# Patient Record
Sex: Male | Born: 1950 | Race: Black or African American | Hispanic: No | Marital: Single | Smoking: Never smoker
Health system: Southern US, Community
[De-identification: ages and names within clinical notes are randomized; demographics above are authoritative.]

## PROBLEM LIST (undated history)

## (undated) DIAGNOSIS — E538 Deficiency of other specified B group vitamins: Secondary | ICD-10-CM

## (undated) DIAGNOSIS — R7989 Other specified abnormal findings of blood chemistry: Secondary | ICD-10-CM

## (undated) DIAGNOSIS — I10 Essential (primary) hypertension: Secondary | ICD-10-CM

## (undated) DIAGNOSIS — R945 Abnormal results of liver function studies: Secondary | ICD-10-CM

## (undated) DIAGNOSIS — N179 Acute kidney failure, unspecified: Secondary | ICD-10-CM

## (undated) DIAGNOSIS — K469 Unspecified abdominal hernia without obstruction or gangrene: Secondary | ICD-10-CM

## (undated) DIAGNOSIS — R310 Gross hematuria: Secondary | ICD-10-CM

## (undated) DIAGNOSIS — D509 Iron deficiency anemia, unspecified: Secondary | ICD-10-CM

## (undated) DIAGNOSIS — N139 Obstructive and reflux uropathy, unspecified: Secondary | ICD-10-CM

## (undated) DIAGNOSIS — E875 Hyperkalemia: Secondary | ICD-10-CM

## (undated) DIAGNOSIS — N289 Disorder of kidney and ureter, unspecified: Secondary | ICD-10-CM

## (undated) HISTORY — DX: Abnormal results of liver function studies: R94.5

## (undated) HISTORY — DX: Hyperkalemia: E87.5

## (undated) HISTORY — DX: Iron deficiency anemia, unspecified: D50.9

## (undated) HISTORY — DX: Acute kidney failure, unspecified: N17.9

## (undated) HISTORY — DX: Other specified abnormal findings of blood chemistry: R79.89

## (undated) SURGERY — CYSTOSCOPY, WITH RETROGRADE PYELOGRAM AND URETERAL STENT INSERTION
Anesthesia: Choice | Laterality: Bilateral

---

## 2003-04-01 ENCOUNTER — Ambulatory Visit (HOSPITAL_COMMUNITY): Admission: RE | Admit: 2003-04-01 | Discharge: 2003-04-01 | Payer: Self-pay | Admitting: Internal Medicine

## 2012-01-17 ENCOUNTER — Other Ambulatory Visit: Payer: Self-pay | Admitting: Family Medicine

## 2012-01-17 ENCOUNTER — Ambulatory Visit (HOSPITAL_COMMUNITY)
Admission: RE | Admit: 2012-01-17 | Discharge: 2012-01-17 | Disposition: A | Discharge status: Home / Self Care | Payer: PRIVATE HEALTH INSURANCE | Source: Ambulatory Visit | Attending: Family Medicine | Admitting: Family Medicine

## 2012-01-17 ENCOUNTER — Encounter (HOSPITAL_COMMUNITY): Payer: Self-pay

## 2012-01-17 DIAGNOSIS — K7689 Other specified diseases of liver: Secondary | ICD-10-CM | POA: Insufficient documentation

## 2012-01-17 DIAGNOSIS — K429 Umbilical hernia without obstruction or gangrene: Secondary | ICD-10-CM | POA: Insufficient documentation

## 2012-01-17 DIAGNOSIS — R319 Hematuria, unspecified: Secondary | ICD-10-CM | POA: Insufficient documentation

## 2012-01-17 DIAGNOSIS — N200 Calculus of kidney: Secondary | ICD-10-CM | POA: Insufficient documentation

## 2012-01-17 DIAGNOSIS — R1032 Left lower quadrant pain: Secondary | ICD-10-CM | POA: Insufficient documentation

## 2012-01-17 HISTORY — DX: Essential (primary) hypertension: I10

## 2012-02-07 ENCOUNTER — Emergency Department (HOSPITAL_COMMUNITY): Payer: PRIVATE HEALTH INSURANCE

## 2012-02-07 ENCOUNTER — Encounter (HOSPITAL_COMMUNITY): Payer: Self-pay | Admitting: *Deleted

## 2012-02-07 ENCOUNTER — Inpatient Hospital Stay (HOSPITAL_COMMUNITY)
Admission: EM | Admit: 2012-02-07 | Discharge: 2012-02-10 | DRG: 694 | Disposition: A | Discharge status: Home / Self Care | Payer: PRIVATE HEALTH INSURANCE | Attending: Internal Medicine | Admitting: Internal Medicine

## 2012-02-07 DIAGNOSIS — N201 Calculus of ureter: Principal | ICD-10-CM | POA: Diagnosis present

## 2012-02-07 DIAGNOSIS — R310 Gross hematuria: Secondary | ICD-10-CM | POA: Diagnosis present

## 2012-02-07 DIAGNOSIS — N139 Obstructive and reflux uropathy, unspecified: Secondary | ICD-10-CM | POA: Diagnosis present

## 2012-02-07 DIAGNOSIS — R112 Nausea with vomiting, unspecified: Secondary | ICD-10-CM | POA: Diagnosis present

## 2012-02-07 DIAGNOSIS — E870 Hyperosmolality and hypernatremia: Secondary | ICD-10-CM | POA: Diagnosis present

## 2012-02-07 DIAGNOSIS — N19 Unspecified kidney failure: Secondary | ICD-10-CM

## 2012-02-07 DIAGNOSIS — E872 Acidosis, unspecified: Secondary | ICD-10-CM | POA: Diagnosis present

## 2012-02-07 DIAGNOSIS — N1330 Unspecified hydronephrosis: Secondary | ICD-10-CM | POA: Diagnosis present

## 2012-02-07 DIAGNOSIS — N179 Acute kidney failure, unspecified: Secondary | ICD-10-CM | POA: Diagnosis present

## 2012-02-07 DIAGNOSIS — N200 Calculus of kidney: Secondary | ICD-10-CM | POA: Diagnosis present

## 2012-02-07 DIAGNOSIS — I10 Essential (primary) hypertension: Secondary | ICD-10-CM | POA: Diagnosis present

## 2012-02-07 DIAGNOSIS — D649 Anemia, unspecified: Secondary | ICD-10-CM | POA: Diagnosis present

## 2012-02-07 DIAGNOSIS — E875 Hyperkalemia: Secondary | ICD-10-CM | POA: Diagnosis present

## 2012-02-07 HISTORY — DX: Disorder of kidney and ureter, unspecified: N28.9

## 2012-02-07 HISTORY — DX: Obstructive and reflux uropathy, unspecified: N13.9

## 2012-02-07 HISTORY — DX: Deficiency of other specified B group vitamins: E53.8

## 2012-02-07 HISTORY — DX: Unspecified abdominal hernia without obstruction or gangrene: K46.9

## 2012-02-07 HISTORY — DX: Gross hematuria: R31.0

## 2012-02-07 LAB — URINE MICROSCOPIC-ADD ON

## 2012-02-07 LAB — COMPREHENSIVE METABOLIC PANEL
AST: 15 U/L (ref 0–37)
Alkaline Phosphatase: 27 U/L — ABNORMAL LOW (ref 39–117)
BUN: 74 mg/dL — ABNORMAL HIGH (ref 6–23)
CO2: 17 mEq/L — ABNORMAL LOW (ref 19–32)
Chloride: 111 mEq/L (ref 96–112)
Creatinine, Ser: 5.75 mg/dL — ABNORMAL HIGH (ref 0.50–1.35)
GFR calc non Af Amer: 10 mL/min — ABNORMAL LOW (ref >90)
Potassium: 6.2 mEq/L — ABNORMAL HIGH (ref 3.5–5.1)
Total Bilirubin: 0.2 mg/dL — ABNORMAL LOW (ref 0.3–1.2)

## 2012-02-07 LAB — CBC
HCT: 40.7 % (ref 39.0–52.0)
MCV: 92.3 fL (ref 78.0–100.0)
RBC: 4.41 MIL/uL (ref 4.22–5.81)
WBC: 6 10*3/uL (ref 4.0–10.5)

## 2012-02-07 LAB — BASIC METABOLIC PANEL
CO2: 20 mEq/L (ref 19–32)
Chloride: 111 mEq/L (ref 96–112)
Potassium: 5.6 mEq/L — ABNORMAL HIGH (ref 3.5–5.1)
Sodium: 141 mEq/L (ref 135–145)

## 2012-02-07 LAB — URINALYSIS, ROUTINE W REFLEX MICROSCOPIC
Glucose, UA: NEGATIVE mg/dL
Leukocytes, UA: NEGATIVE
Nitrite: NEGATIVE
Specific Gravity, Urine: 1.01 (ref 1.005–1.030)
pH: 5.5 (ref 5.0–8.0)

## 2012-02-07 LAB — SURGICAL PCR SCREEN: MRSA, PCR: NEGATIVE

## 2012-02-07 MED ORDER — ACETAMINOPHEN 325 MG PO TABS: 650.0000 mg | ORAL_TABLET | Freq: Four times a day (QID) | ORAL | Status: DC | PRN

## 2012-02-07 MED ORDER — HEPARIN SODIUM (PORCINE) 5000 UNIT/ML IJ SOLN
5000.0000 [IU] | Freq: Three times a day (TID) | INTRAMUSCULAR | Status: DC
  Administered 2012-02-07 – 2012-02-10 (×8): 5000 [IU] via SUBCUTANEOUS
  Filled 2012-02-07 (×8): qty 1

## 2012-02-07 MED ORDER — DOCUSATE SODIUM 100 MG PO CAPS
100.0000 mg | ORAL_CAPSULE | Freq: Two times a day (BID) | ORAL | Status: DC
  Administered 2012-02-07 – 2012-02-10 (×5): 100 mg via ORAL
  Filled 2012-02-07 (×6): qty 1

## 2012-02-07 MED ORDER — SODIUM CHLORIDE 0.9 % IV SOLN
Freq: Once | INTRAVENOUS | Status: AC
  Administered 2012-02-07: 08:00:00 via INTRAVENOUS

## 2012-02-07 MED ORDER — ONDANSETRON HCL 4 MG PO TABS: 4.0000 mg | ORAL_TABLET | Freq: Four times a day (QID) | ORAL | Status: DC | PRN

## 2012-02-07 MED ORDER — ONDANSETRON HCL 4 MG/2ML IJ SOLN
4.0000 mg | Freq: Once | INTRAMUSCULAR | Status: AC
  Administered 2012-02-07: 4 mg via INTRAVENOUS
  Filled 2012-02-07: qty 2

## 2012-02-07 MED ORDER — SODIUM CHLORIDE 0.9 % IV SOLN
INTRAVENOUS | Status: DC
  Administered 2012-02-07: 125 mL/h via INTRAVENOUS
  Administered 2012-02-08 (×2): via INTRAVENOUS

## 2012-02-07 MED ORDER — SODIUM POLYSTYRENE SULFONATE 15 GM/60ML PO SUSP
30.0000 g | Freq: Once | ORAL | Status: AC
  Administered 2012-02-07: 30 g via ORAL
  Filled 2012-02-07: qty 120

## 2012-02-07 MED ORDER — SODIUM CHLORIDE 0.9 % IV BOLUS (SEPSIS): 1000.0000 mL | Freq: Once | INTRAVENOUS | Status: DC

## 2012-02-07 MED ORDER — SODIUM POLYSTYRENE SULFONATE 15 GM/60ML PO SUSP
45.0000 g | Freq: Once | ORAL | Status: AC
  Administered 2012-02-07: 45 g via ORAL
  Filled 2012-02-07: qty 180

## 2012-02-07 MED ORDER — ACETAMINOPHEN 650 MG RE SUPP: 650.0000 mg | Freq: Four times a day (QID) | RECTAL | Status: DC | PRN

## 2012-02-07 MED ORDER — ONDANSETRON HCL 4 MG/2ML IJ SOLN: 4.0000 mg | Freq: Four times a day (QID) | INTRAMUSCULAR | Status: DC | PRN

## 2012-02-07 MED ORDER — SODIUM CHLORIDE 0.9 % IV SOLN: INTRAVENOUS | Status: DC

## 2012-02-07 MED ORDER — HYDROCODONE-ACETAMINOPHEN 5-325 MG PO TABS
1.0000 | ORAL_TABLET | ORAL | Status: DC | PRN
  Administered 2012-02-07 – 2012-02-08 (×2): 1 via ORAL
  Administered 2012-02-09 (×4): 2 via ORAL
  Administered 2012-02-10: 1 via ORAL
  Administered 2012-02-10 (×2): 2 via ORAL
  Filled 2012-02-07: qty 1
  Filled 2012-02-07 (×5): qty 2
  Filled 2012-02-07: qty 1
  Filled 2012-02-07 (×2): qty 2

## 2012-02-07 NOTE — H&P (Addendum)
Patient's PCP: Carmelina Dane  Chief Complaint: decreased appetite/nausea  History of Present Illness: [name] is a 61 y.o. AA male who presents with a 2 weeks history of decreased appetite and nausea.  This started when he was seen in the ER with kidney stone in March.   On CT scan he was found to have:Bilateral distal ureteral stones, 10 mm stone in the distal left ureter and 5 mm stone in the distal right ureter. Moderate left hydronephrosis. No hydronephrosis on the right. The right kidney is atrophic.  He has a history of kidney stones in the past requiring lithotripsy and a stent (years ago).  He states his urine output has been good but initially in march there was quite a bit of blood.  Denies fever and chills.     Meds: Scheduled Meds:    . sodium chloride   Intravenous Once  . sodium chloride   Intravenous STAT  . ondansetron  4 mg Intravenous Once  . sodium polystyrene  30 g Oral Once  . DISCONTD: sodium chloride  1,000 mL Intravenous Once   Continuous Infusions:  PRN Meds:. Allergies: Review of patient's allergies indicates no known allergies. Past Medical History  Diagnosis Date  . Hypertension   . B12 deficiency   . Hernia     umbilical  . Renal disorder     kidney stones   No past surgical history on file. No family history on file. History   Social History  . Marital Status: Single    Spouse Name: N/A    Number of Children: N/A  . Years of Education: N/A   Occupational History  . Not on file.   Social History Main Topics  . Smoking status: Not on file  . Smokeless tobacco: Not on file  . Alcohol Use: No  . Drug Use: No  . Sexually Active: Not on file   Other Topics Concern  . Not on file   Social History Narrative   Lives with fianceNot currently working   Review of Systems: All systems reviewed with the patient and positive as per history of present illness, otherwise all other systems are negative.   Physical Exam: Blood  pressure 122/80, pulse 91, temperature 97.6 F (36.4 C), temperature source Oral, resp. rate 20, height 5\' 10"  (1.778 m), weight 105.235 kg (232 lb), SpO2 98.00%. General: Awake, Oriented x3, No acute distress. HEENT: EOMI, dry mucous membranes Neck: Supple CV: S1 and S2, RRR Lungs: Clear to ascultation bilaterally, no wheezing Abdomen: Soft, Nontender, Nondistended, +bowel sounds, no CVA tenderness Ext: Good pulses. No edema. No clubbing or cyanosis noted. Neuro: Cranial Nerves II-XII grossly intact. Has 5/5 motor strength in upper and lower extremities.   Lab results:  [facility] 02/07/12 0654  NA 140  K 6.2*  CL 111  CO2 17*  GLUCOSE 120*  BUN 74*  CREATININE 5.75*  CALCIUM 10.1  MG --  PHOS --    Basename 02/07/12 0654  AST 15  ALT 19  ALKPHOS 27*  BILITOT 0.2*  PROT 8.2  ALBUMIN 4.0    Basename 02/07/12 0654  LIPASE 71*  AMYLASE --    Basename 02/07/12 0654  WBC 6.0  NEUTROABS --  HGB 13.0  HCT 40.7  MCV 92.3  PLT 306   No results found for this basename: CKTOTAL:3,CKMB:3,CKMBINDEX:3,TROPONINI:3 in the last 72 hours No components found with this basename: POCBNP:3 No results found for this basename: DDIMER in the last 72 hours No results found  for this basename: HGBA1C:2 in the last 72 hours No results found for this basename: CHOL:2,HDL:2,LDLCALC:2,TRIG:2,CHOLHDL:2,LDLDIRECT:2 in the last 72 hours No results found for this basename: TSH,T4TOTAL,FREET3,T3FREE,THYROIDAB in the last 72 hours No results found for this basename: VITAMINB12:2,FOLATE:2,FERRITIN:2,TIBC:2,IRON:2,RETICCTPCT:2 in the last 72 hours Imaging results:  Ct Abdomen Pelvis Wo Contrast  02/07/2012  *RADIOLOGY REPORT*  Clinical Data: Nausea.  Recent passage of kidney stones.  CT ABDOMEN AND PELVIS WITHOUT CONTRAST  Technique:  Multidetector CT imaging of the abdomen and pelvis was performed following the standard protocol without intravenous contrast.  Comparison: 01/17/2012  Findings: A  left kidney lower pole nonobstructive calculus measures 1.8 x 1.2 cm on image 32 of series 2.  Right renal atrophy noted.  A 0.9 x 0.5 cm right collecting system calculus is present and there is a 0.5 cm right UVJ calculus which currently appears nonobstructive.  There is reduced hydronephrosis on the left compared the prior exam.  There is continued left hydroureter, that the large left distal ureteral calculus shown on the prior exam is no longer readily apparent.  There is a transitional in left ureteral caliber in the distal ureter.  The right-sided previously seen distal ureteral calculus is no longer visible. 10.  No calculus is visible in the urinary bladder.  Stable fullness of the lateral limb of the left adrenal gland noted.  An umbilical hernia contains adipose tissue.  The appendix appears normal. Bridging spurring of the sacroiliac joints noted.  Disc bulges noted at the L3-4, L4-5, and L5-S1 levels.  IMPRESSION:  1.  The previously seen distal ureteral calculi are no longer readily apparent.  The left hydronephrosis has resolved, but there is continued left hydroureter extending down to the distal ureter. 2.  Large left kidney lower pole nonobstructive calculus, stable. 3.  5 mm right UVJ calculus is not currently causing hydronephrosis.  There is also a 9 mm right collecting system calculus. 4.  Umbilical hernia contains adipose tissue. 5.  Lower lumbar spondylosis.  Original Report Authenticated By: Dellia Cloud, M.D.   Ct Abdomen Pelvis Wo Contrast  01/17/2012  *RADIOLOGY REPORT*  Clinical Data: Hematuria, left flank pain.  CT ABDOMEN AND PELVIS WITHOUT CONTRAST  Technique:  Multidetector CT imaging of the abdomen and pelvis was performed following the standard protocol without intravenous contrast.  Comparison: None  Findings: Lung bases are clear.  No effusions.  Heart is normal size.  There is moderate left hydronephrosis and hydroureter.  10 mm distal left ureteral stones.  Right  kidney is atrophic.  6 mm stone is layering dependently in the right renal pelvis.  Despite right hydronephrosis, there is a 5 mm distal right ureteral stone. Bilateral nephrolithiasis.  15 mm stone in the lower pole of the left kidney.  Diffuse fatty infiltration of the liver.  Spleen, pancreas, adrenals and gallbladder are unremarkable.  There is an umbilical hernia containing fat.  Appendix is visualized and is normal. Bowel grossly unremarkable.  No free fluid, free air, or adenopathy.  IMPRESSION: Bilateral distal ureteral stones, 10 mm stone in the distal left ureter and 5 mm stone in the distal right ureter.  Moderate left hydronephrosis.  No hydronephrosis on the right.  The right kidney is atrophic.  Bilateral nephrolithiasis.  Fatty liver.  Umbilical hernia containing fat.  Original Report Authenticated By: Cyndie Chime, M.D.   Other results: EKG: NSR, no peaked T waves  Assessment & Plan by Problem:   Nausea & vomiting- zofran PRN, seems to have resolved  some, patient eating lunch, encourage PO fluid intake- not eating/drinking well for 2 weeks   AKI (acute kidney injury)- IVF- ? Dehydration in combination with kidney stones and lisinopril, await U/A, trend Cr on BMP and consider nephrology if not better, monitor I/Os, patient denies history of increased Cr- only kidney stones   Hyperkalemia- kay exelate, recheck BMP this evening   Renal calculi- will consult urology to see if intervention needed- patient seems to have passed stone causes the obstruction  Dehydration- IVF   ADDENDUM: consulted urology, plan to place B/L stents tomm to help with hydronephrosis and stones   Time spent on admission, talking to the patient, and coordinating care was: 55 mins.  , , DO 02/07/2012, 12:39 PM

## 2012-02-07 NOTE — ED Notes (Signed)
Pt cont to wait for inpt bed. Pt and family aware

## 2012-02-07 NOTE — ED Notes (Signed)
Reports decreased appetite x 2 weeks; denies pain; denies n/v

## 2012-02-07 NOTE — ED Notes (Signed)
Pt waiting for bed assignment. Pt and family aware. Pt aware of need for urine specimen. Unable to void at present

## 2012-02-07 NOTE — ED Notes (Signed)
EDP  back in with pt. Pt to be admit to hospital

## 2012-02-07 NOTE — ED Provider Notes (Addendum)
History  This chart was scribed for Benny Lennert, MD by Cherlynn Perches. The patient was seen in room APA12/APA12. Patient's care was started at 0627.    CSN: [id number]  Arrival date & time 02/07/12  3716   First MD Initiated Contact with Patient 02/07/12 [phone]      Chief Complaint  Patient presents with  . Anorexia    decreased appetite x 2 weeks    (Consider location/radiation/quality/duration/timing/severity/associated sxs/prior treatment) Patient is a 61 y.o. male presenting with weakness. The history is provided by the patient. No language interpreter was used.  Weakness The primary symptoms include nausea. Primary symptoms do not include headaches, seizures or fever. The symptoms began more than 1 week ago. The symptoms are unchanged. The neurological symptoms are multifocal. Context: nothing.  Additional symptoms include weakness. Additional symptoms do not include hallucinations.    [name] is a 61 y.o. male with a h/o kidney stones who presents to the Emergency Department complaining of 3 days of sudden onset, unchanging decreased appetite with associated nausea. Pt reports visiting the [facility] a week ago for kidney stones. A CT scan of the abdomen confirmed that he had two kidney stones, one in each kidney. Pt states that he has had kidney stones "quite a few times," but is unsure if they have every been analyzed. Pt reports passing both kidney stones on Saturday. Pt states that his decreased appetite began after passing the stones. Pt reports that nausea worsens when he tries to eat. Pt denies hematuria at present, but states that he noticed blood in his urine before passing the kidney stones. Pt denies abdominal pain, fever, chills. Pt has a history of HTN.  Past Medical History  Diagnosis Date  . Hypertension   . B12 deficiency   . Hernia     umbilical  . Renal disorder     kidney stones    No past surgical history on file.  No  family history on file.  History  Substance Use Topics  . Smoking status: Not on file  . Smokeless tobacco: Not on file  . Alcohol Use:       Review of Systems  Constitutional: Positive for appetite change. Negative for fever, chills and fatigue.  HENT: Negative for congestion, sinus pressure and ear discharge.   Eyes: Negative for discharge.  Respiratory: Negative for cough.   Cardiovascular: Negative for chest pain.  Gastrointestinal: Positive for nausea. Negative for abdominal pain and diarrhea.  Genitourinary: Negative for frequency and hematuria.  Musculoskeletal: Negative for back pain.  Skin: Negative for rash.  Neurological: Positive for weakness. Negative for seizures and headaches.  Hematological: Negative.   Psychiatric/Behavioral: Negative for hallucinations.    Allergies  Review of patient's allergies indicates no known allergies.  Home Medications   Current Outpatient Rx  Name Route Sig Dispense Refill  . CYANOCOBALAMIN 1000 MCG PO TABS Intramuscular Inject 100 mcg into the muscle every 30 (thirty) days.    Marland Kitchen LISINOPRIL 20 MG PO TABS Oral Take 20 mg by mouth daily.      Triage Vitals: BP 113/71  Pulse 84  Resp 20  Ht 5\' 9"  (1.753 m)  Wt 232 lb (105.235 kg)  BMI 34.26 kg/m2  SpO2 97%  Physical Exam  Nursing note and vitals reviewed. Constitutional: He is oriented to person, place, and time. He appears well-developed and well-nourished.  HENT:  Head: Normocephalic and atraumatic.       Dry mucous membranes  Eyes: Conjunctivae  and EOM are normal. Pupils are equal, round, and reactive to light. No scleral icterus.  Neck: Normal range of motion. Neck supple. No thyromegaly present.  Cardiovascular: Normal rate and regular rhythm.  Exam reveals no gallop and no friction rub.   No murmur heard. Pulmonary/Chest: Effort normal and breath sounds normal. No stridor. He has no wheezes. He has no rales. He exhibits no tenderness.  Abdominal: Soft. He exhibits  no distension. There is no tenderness. There is no rebound.       Umbilical hernia - 3 cm in diameter, non-tender  Musculoskeletal: Normal range of motion. He exhibits no edema.  Lymphadenopathy:    He has no cervical adenopathy.  Neurological: He is alert and oriented to person, place, and time. Coordination normal.  Skin: Skin is warm and dry. No rash noted. No erythema.  Psychiatric: He has a normal mood and affect. His behavior is normal.    ED Course  Procedures (including critical care time)  DIAGNOSTIC STUDIES: Oxygen Saturation is 97% on room air, adequate by my interpretation.    COORDINATION OF CARE:  7:29 AM - Will give pt fluid for dehydration and look at previous scans. Discussed treatment plan with pt and he agrees.    Labs Reviewed  CBC  COMPREHENSIVE METABOLIC PANEL  LIPASE, BLOOD   No results found.   No diagnosis found.  Date: 02/07/2012  Rate: 84  Rhythm: normal sinus rhythm  QRS Axis: normal  Intervals: normal  ST/T Wave abnormalities: normal  Conduction Disutrbances:none  Narrative Interpretation:   Old EKG Reviewed: none available     MDM     The chart was scribed for me under my direct supervision.  I personally performed the history, physical, and medical decision making and all procedures in the evaluation of this patient.Benny Lennert, MD 02/07/12 0830  Benny Lennert, MD 02/08/12 [phone]

## 2012-02-07 NOTE — ED Provider Notes (Signed)
History     CSN: [id number]  Arrival date & time 02/07/12  4098   First MD Initiated Contact with Patient 02/07/12 [phone]      Chief Complaint  Patient presents with  . Anorexia    decreased appetite x 2 weeks    (Consider location/radiation/quality/duration/timing/severity/associated sxs/prior treatment) HPI  Past Medical History  Diagnosis Date  . Hypertension   . B12 deficiency   . Hernia     umbilical  . Renal disorder     kidney stones    No past surgical history on file.  No family history on file.  History  Substance Use Topics  . Smoking status: Not on file  . Smokeless tobacco: Not on file  . Alcohol Use:       Review of Systems  Allergies  Review of patient's allergies indicates no known allergies.  Home Medications   Current Outpatient Rx  Name Route Sig Dispense Refill  . CYANOCOBALAMIN 1000 MCG PO TABS Intramuscular Inject 100 mcg into the muscle every 30 (thirty) days.    Marland Kitchen LISINOPRIL 20 MG PO TABS Oral Take 20 mg by mouth daily.      BP 113/71  Pulse 84  Resp 20  Ht 5\' 9"  (1.753 m)  Wt 232 lb (105.235 kg)  BMI 34.26 kg/m2  SpO2 97%  Physical Exam  ED Course  Procedures (including critical care time)   Labs Reviewed  CBC  COMPREHENSIVE METABOLIC PANEL  LIPASE, BLOOD   No results found.   No diagnosis found.    MDM          Benny Lennert, MD 02/08/12 [phone]

## 2012-02-07 NOTE — Consult Note (Signed)
Report#[id number]

## 2012-02-08 ENCOUNTER — Encounter (HOSPITAL_COMMUNITY): Payer: Self-pay | Admitting: Anesthesiology

## 2012-02-08 ENCOUNTER — Encounter (HOSPITAL_COMMUNITY)
Admission: EM | Disposition: A | Discharge status: Home / Self Care | Payer: Self-pay | Source: Home / Self Care | Attending: Internal Medicine

## 2012-02-08 ENCOUNTER — Inpatient Hospital Stay (HOSPITAL_COMMUNITY): Payer: PRIVATE HEALTH INSURANCE | Admitting: Anesthesiology

## 2012-02-08 ENCOUNTER — Encounter (HOSPITAL_COMMUNITY): Payer: Self-pay | Admitting: Internal Medicine

## 2012-02-08 ENCOUNTER — Encounter (HOSPITAL_COMMUNITY): Payer: Self-pay | Admitting: *Deleted

## 2012-02-08 ENCOUNTER — Inpatient Hospital Stay (HOSPITAL_COMMUNITY): Payer: PRIVATE HEALTH INSURANCE

## 2012-02-08 DIAGNOSIS — E870 Hyperosmolality and hypernatremia: Secondary | ICD-10-CM | POA: Diagnosis present

## 2012-02-08 DIAGNOSIS — N139 Obstructive and reflux uropathy, unspecified: Secondary | ICD-10-CM

## 2012-02-08 HISTORY — PX: CYSTOSCOPY W/ URETERAL STENT PLACEMENT: SHX1429

## 2012-02-08 HISTORY — DX: Obstructive and reflux uropathy, unspecified: N13.9

## 2012-02-08 LAB — CBC
Hemoglobin: 11.6 g/dL — ABNORMAL LOW (ref 13.0–17.0)
MCH: 29.7 pg (ref 26.0–34.0)
MCV: 93.1 fL (ref 78.0–100.0)
RBC: 3.91 MIL/uL — ABNORMAL LOW (ref 4.22–5.81)

## 2012-02-08 LAB — POCT I-STAT 4, (NA,K, GLUC, HGB,HCT)
HCT: 38 % — ABNORMAL LOW (ref 39.0–52.0)
Hemoglobin: 12.9 g/dL — ABNORMAL LOW (ref 13.0–17.0)
Potassium: 5.2 mEq/L — ABNORMAL HIGH (ref 3.5–5.1)

## 2012-02-08 LAB — BASIC METABOLIC PANEL
CO2: 21 mEq/L (ref 19–32)
Chloride: 118 mEq/L — ABNORMAL HIGH (ref 96–112)
GFR calc non Af Amer: 13 mL/min — ABNORMAL LOW (ref >90)
Glucose, Bld: 106 mg/dL — ABNORMAL HIGH (ref 70–99)
Potassium: 5.8 mEq/L — ABNORMAL HIGH (ref 3.5–5.1)
Sodium: 147 mEq/L — ABNORMAL HIGH (ref 135–145)

## 2012-02-08 SURGERY — CYSTOSCOPY, WITH RETROGRADE PYELOGRAM AND URETERAL STENT INSERTION
Anesthesia: Spinal | Site: Penis | Laterality: Bilateral | Wound class: Clean Contaminated

## 2012-02-08 MED ORDER — LIDOCAINE HCL (PF) 1 % IJ SOLN
INTRAMUSCULAR | Status: AC
  Filled 2012-02-08: qty 5

## 2012-02-08 MED ORDER — FENTANYL CITRATE 0.05 MG/ML IJ SOLN: 25.0000 ug | INTRAMUSCULAR | Status: DC | PRN

## 2012-02-08 MED ORDER — MIDAZOLAM HCL 2 MG/2ML IJ SOLN
INTRAMUSCULAR | Status: AC
  Filled 2012-02-08: qty 2

## 2012-02-08 MED ORDER — MIDAZOLAM HCL 2 MG/2ML IJ SOLN
1.0000 mg | INTRAMUSCULAR | Status: DC | PRN
Start: 2012-02-08 — End: 2012-02-08
  Administered 2012-02-08: 2 mg via INTRAVENOUS

## 2012-02-08 MED ORDER — 0.9 % SODIUM CHLORIDE (POUR BTL) OPTIME
TOPICAL | Status: DC | PRN
  Administered 2012-02-08: 1000 mL

## 2012-02-08 MED ORDER — SODIUM CHLORIDE 0.9 % IV SOLN
INTRAVENOUS | Status: DC
  Administered 2012-02-08: 800 mL via INTRAVENOUS
  Administered 2012-02-08: 18:00:00 via INTRAVENOUS

## 2012-02-08 MED ORDER — SODIUM CHLORIDE 0.9 % IR SOLN
Status: DC | PRN
  Administered 2012-02-08: 3000 mL

## 2012-02-08 MED ORDER — FENTANYL CITRATE 0.05 MG/ML IJ SOLN
INTRAMUSCULAR | Status: AC
  Filled 2012-02-08: qty 2

## 2012-02-08 MED ORDER — LIDOCAINE IN DEXTROSE 5-7.5 % IV SOLN
INTRAVENOUS | Status: DC | PRN
  Administered 2012-02-08: 8 mg via INTRATHECAL

## 2012-02-08 MED ORDER — PROPOFOL 10 MG/ML IV EMUL
INTRAVENOUS | Status: AC
  Filled 2012-02-08: qty 20

## 2012-02-08 MED ORDER — PHENYLEPHRINE HCL 10 MG/ML IJ SOLN
INTRAMUSCULAR | Status: DC | PRN
  Administered 2012-02-08: 100 ug via INTRAVENOUS
  Administered 2012-02-08 (×2): 50 ug via INTRAVENOUS
  Administered 2012-02-08 (×3): 100 ug via INTRAVENOUS

## 2012-02-08 MED ORDER — IOHEXOL 350 MG/ML SOLN
INTRAVENOUS | Status: DC | PRN
  Administered 2012-02-08 (×2): 50 mL

## 2012-02-08 MED ORDER — DEXTROSE-NACL 5-0.45 % IV SOLN
INTRAVENOUS | Status: DC
  Administered 2012-02-08: 1000 mL via INTRAVENOUS
  Administered 2012-02-09: 06:00:00 via INTRAVENOUS

## 2012-02-08 MED ORDER — PHENYLEPHRINE HCL 10 MG/ML IJ SOLN
INTRAMUSCULAR | Status: AC
  Filled 2012-02-08: qty 1

## 2012-02-08 MED ORDER — EPHEDRINE SULFATE 50 MG/ML IJ SOLN
INTRAMUSCULAR | Status: DC | PRN
  Administered 2012-02-08: 5 mg via INTRAVENOUS

## 2012-02-08 MED ORDER — FUROSEMIDE 10 MG/ML IJ SOLN
10.0000 mg | Freq: Two times a day (BID) | INTRAMUSCULAR | Status: DC
  Administered 2012-02-09 – 2012-02-10 (×3): 10 mg via INTRAVENOUS
  Filled 2012-02-08 (×3): qty 2

## 2012-02-08 MED ORDER — ONDANSETRON HCL 4 MG/2ML IJ SOLN
INTRAMUSCULAR | Status: AC
  Filled 2012-02-08: qty 2

## 2012-02-08 MED ORDER — SODIUM CHLORIDE 0.45 % IV SOLN
INTRAVENOUS | Status: DC
  Filled 2012-02-08 (×4): qty 1000

## 2012-02-08 MED ORDER — ONDANSETRON HCL 4 MG/2ML IJ SOLN: 4.0000 mg | Freq: Once | INTRAMUSCULAR | Status: AC | PRN

## 2012-02-08 MED ORDER — FENTANYL CITRATE 0.05 MG/ML IJ SOLN
INTRAMUSCULAR | Status: DC | PRN
  Administered 2012-02-08: 20 ug via INTRAVENOUS
  Administered 2012-02-08: 50 ug via INTRAVENOUS

## 2012-02-08 MED ORDER — MIDAZOLAM HCL 5 MG/5ML IJ SOLN
INTRAMUSCULAR | Status: DC | PRN
  Administered 2012-02-08: 2 mg via INTRAVENOUS

## 2012-02-08 MED ORDER — SODIUM POLYSTYRENE SULFONATE 15 GM/60ML PO SUSP
30.0000 g | Freq: Once | ORAL | Status: AC
  Administered 2012-02-08: 30 g via ORAL
  Filled 2012-02-08: qty 120

## 2012-02-08 MED ORDER — ONDANSETRON HCL 4 MG/2ML IJ SOLN
INTRAMUSCULAR | Status: DC | PRN
  Administered 2012-02-08: 4 mg via INTRAVENOUS

## 2012-02-08 MED ORDER — PROPOFOL 10 MG/ML IV EMUL
INTRAVENOUS | Status: DC | PRN
  Administered 2012-02-08: 25 ug/kg/min via INTRAVENOUS

## 2012-02-08 MED ORDER — PROPOFOL 10 MG/ML IV BOLUS
INTRAVENOUS | Status: DC | PRN
  Administered 2012-02-08 (×3): 10 mg via INTRAVENOUS

## 2012-02-08 SURGICAL SUPPLY — 28 items
BAG DRAIN URO TABLE W/ADPT NS (DRAPE) ×2 IMPLANT
BAG DRN 8 ADPR NS SKTRN CSTL (DRAPE) ×1
BAG DRN URN TUBE DRIP CHMBR (OSTOMY) ×1
BAG URINE DRAIN TURP 4L (OSTOMY) ×1 IMPLANT
CATH 5 FR WEDGE TIP (UROLOGICAL SUPPLIES) ×2 IMPLANT
CATH FOLEY 2WAY SLVR  5CC 18FR (CATHETERS) ×1
CATH FOLEY 2WAY SLVR 5CC 18FR (CATHETERS) IMPLANT
CATH OPEN TIP 5FR (CATHETERS) ×2 IMPLANT
CATH OPEN TIP 6FR (CATHETERS) ×1 IMPLANT
CLOTH BEACON ORANGE TIMEOUT ST (SAFETY) ×2 IMPLANT
DILATOR UROMAX ULTRA (MISCELLANEOUS) IMPLANT
GLIDEWIRE 0.035 8CM ×1 IMPLANT
GLOVE BIO SURGEON STRL SZ7 (GLOVE) ×2 IMPLANT
GLOVE ECLIPSE 6.5 STRL STRAW (GLOVE) ×1 IMPLANT
GLOVE EXAM NITRILE MD LF STRL (GLOVE) ×1 IMPLANT
GLOVE INDICATOR 7.0 STRL GRN (GLOVE) ×1 IMPLANT
GOWN STRL REIN XL XLG (GOWN DISPOSABLE) ×2 IMPLANT
GUIDEWIRE ANG ZIPWIRE 038X150 (WIRE) ×1 IMPLANT
IV NS IRRIG 3000ML ARTHROMATIC (IV SOLUTION) ×5 IMPLANT
KIT ROOM TURNOVER AP CYSTO (KITS) ×2 IMPLANT
MANIFOLD NEPTUNE II (INSTRUMENTS) ×2 IMPLANT
PACK CYSTO (CUSTOM PROCEDURE TRAY) ×2 IMPLANT
PAD ARMBOARD 7.5X6 YLW CONV (MISCELLANEOUS) ×2 IMPLANT
STENT PERCUFLEX 4.8FRX24 (STENTS) ×2 IMPLANT
STONE RETRIEVAL GEMINI 2.4 FR (MISCELLANEOUS) IMPLANT
SYR CONTROL 10ML LL (SYRINGE) ×2 IMPLANT
TOWEL OR 17X26 4PK STRL BLUE (TOWEL DISPOSABLE) ×2 IMPLANT
WIRE GUIDE BENTSON .035 15CM (WIRE) ×2 IMPLANT

## 2012-02-08 NOTE — Anesthesia Preprocedure Evaluation (Signed)
Anesthesia Evaluation  Patient identified by MRN, date of birth, ID band Patient awake    Reviewed: Allergy & Precautions, H&P , NPO status , Patient's Chart, lab work & pertinent test results  History of Anesthesia Complications Negative for: history of anesthetic complications  Airway Mallampati: II TM Distance: >3 FB Neck ROM: Full    Dental  (+) Teeth Intact, Partial Lower and Partial Upper   Pulmonary neg pulmonary ROS,  breath sounds clear to auscultation        Cardiovascular hypertension, Pt. on medications Rhythm:Regular Rate:Normal     Neuro/Psych    GI/Hepatic   Endo/Other    Renal/GU      Musculoskeletal   Abdominal   Peds  Hematology  (+) Blood dyscrasia (B12 def), anemia ,   Anesthesia Other Findings   Reproductive/Obstetrics                           Anesthesia Physical Anesthesia Plan  ASA: II  Anesthesia Plan: Spinal   Post-op Pain Management:    Induction:   Airway Management Planned: Nasal Cannula  Additional Equipment:   Intra-op Plan:   Post-operative Plan:   Informed Consent: I have reviewed the patients History and Physical, chart, labs and discussed the procedure including the risks, benefits and alternatives for the proposed anesthesia with the patient or authorized representative who has indicated his/her understanding and acceptance.     Plan Discussed with:   Anesthesia Plan Comments:         Anesthesia Quick Evaluation

## 2012-02-08 NOTE — Progress Notes (Signed)
Subjective: (The patient was seen this morning. Delayed entry.) The patient has less complaints of flank pain. No complaints of nausea or vomiting.  Objective: Vital signs in last 24 hours: Filed Vitals:   02/08/12 1600 02/08/12 1615 02/08/12 1630 02/08/12 1645  BP: 130/78 124/84 122/80 110/66  Pulse:      Temp:      TempSrc:      Resp: 16 28 18 13   Height:      Weight:      SpO2: 95% 96% 100% 100%    Intake/Output Summary (Last 24 hours) at 02/08/12 1737 Last data filed at 02/08/12 1200  Gross per 24 hour  Intake    240 ml  Output   1000 ml  Net   -760 ml    Weight change: 0 kg (0 lb)  General: Pleasant 61 year old Latin American man sitting up in bed, in no acute distress. Lungs: Clear to auscultation bilaterally. Heart: S1, S2, with no murmurs rubs or gallops. Abdomen: Positive bowel sounds, soft, nontender, nondistended. No CVA tenderness. Extremities: No pedal edema.  Lab Results: Basic Metabolic Panel:  Basename 02/08/12 1349 02/08/12 0531 02/07/12 1707  NA 151* 147* --  K 5.2* 5.8* --  CL -- 118* 111  CO2 -- 21 20  GLUCOSE 101* 106* --  BUN -- 62* 66*  CREATININE -- 4.47* 4.68*  CALCIUM -- 9.2 10.1  MG -- -- --  PHOS -- -- --   Liver Function Tests:  [facility] 02/07/12 0654  AST 15  ALT 19  ALKPHOS 27*  BILITOT 0.2*  PROT 8.2  ALBUMIN 4.0    Basename 02/07/12 0654  LIPASE 71*  AMYLASE --   No results found for this basename: AMMONIA:2 in the last 72 hours CBC:  Basename 02/08/12 1349 02/08/12 0531 02/07/12 0654  WBC -- 4.4 6.0  NEUTROABS -- -- --  HGB 12.9* 11.6* --  HCT 38.0* 36.4* --  MCV -- 93.1 92.3  PLT -- 295 306   Cardiac Enzymes: No results found for this basename: CKTOTAL:3,CKMB:3,CKMBINDEX:3,TROPONINI:3 in the last 72 hours BNP: No results found for this basename: PROBNP:3 in the last 72 hours D-Dimer: No results found for this basename: DDIMER:2 in the last 72 hours CBG: No results found for this basename: GLUCAP:6 in  the last 72 hours Hemoglobin A1C: No results found for this basename: HGBA1C in the last 72 hours Fasting Lipid Panel: No results found for this basename: CHOL,HDL,LDLCALC,TRIG,CHOLHDL,LDLDIRECT in the last 72 hours Thyroid Function Tests: No results found for this basename: TSH,T4TOTAL,FREET4,T3FREE,THYROIDAB in the last 72 hours Anemia Panel: No results found for this basename: VITAMINB12,FOLATE,FERRITIN,TIBC,IRON,RETICCTPCT in the last 72 hours Coagulation: No results found for this basename: LABPROT:2,INR:2 in the last 72 hours Urine Drug Screen: Drugs of Abuse  No results found for this basename: labopia,  cocainscrnur,  labbenz,  amphetmu,  thcu,  labbarb    Alcohol Level: No results found for this basename: ETH:2 in the last 72 hours Urinalysis:  Basename 02/07/12 1719  COLORURINE STRAW*  LABSPEC 1.010  PHURINE 5.5  GLUCOSEU NEGATIVE  HGBUR SMALL*  BILIRUBINUR NEGATIVE  KETONESUR NEGATIVE  PROTEINUR NEGATIVE  UROBILINOGEN 0.2  NITRITE NEGATIVE  LEUKOCYTESUR NEGATIVE   Misc. Labs:   Micro: Recent Results (from the past 240 hour(s))  SURGICAL PCR SCREEN     Status: Normal   Collection Time   02/07/12  7:37 PM      Component Value Range Status Comment   MRSA, PCR NEGATIVE  NEGATIVE  Final  Staphylococcus aureus NEGATIVE  NEGATIVE  Final     Studies/Results: Ct Abdomen Pelvis Wo Contrast  02/07/2012  *RADIOLOGY REPORT*  Clinical Data: Nausea.  Recent passage of kidney stones.  CT ABDOMEN AND PELVIS WITHOUT CONTRAST  Technique:  Multidetector CT imaging of the abdomen and pelvis was performed following the standard protocol without intravenous contrast.  Comparison: 01/17/2012  Findings: A left kidney lower pole nonobstructive calculus measures 1.8 x 1.2 cm on image 32 of series 2.  Right renal atrophy noted.  A 0.9 x 0.5 cm right collecting system calculus is present and there is a 0.5 cm right UVJ calculus which currently appears nonobstructive.  There is reduced  hydronephrosis on the left compared the prior exam.  There is continued left hydroureter, that the large left distal ureteral calculus shown on the prior exam is no longer readily apparent.  There is a transitional in left ureteral caliber in the distal ureter.  The right-sided previously seen distal ureteral calculus is no longer visible. 10.  No calculus is visible in the urinary bladder.  Stable fullness of the lateral limb of the left adrenal gland noted.  An umbilical hernia contains adipose tissue.  The appendix appears normal. Bridging spurring of the sacroiliac joints noted.  Disc bulges noted at the L3-4, L4-5, and L5-S1 levels.  IMPRESSION:  1.  The previously seen distal ureteral calculi are no longer readily apparent.  The left hydronephrosis has resolved, but there is continued left hydroureter extending down to the distal ureter. 2.  Large left kidney lower pole nonobstructive calculus, stable. 3.  5 mm right UVJ calculus is not currently causing hydronephrosis.  There is also a 9 mm right collecting system calculus. 4.  Umbilical hernia contains adipose tissue. 5.  Lower lumbar spondylosis.  Original Report Authenticated By: Dellia Cloud, M.D.    Medications: I have reviewed the patient's current medications.  Assessment: Active Problems:  Nausea & vomiting  AKI (acute kidney injury)  Hyperkalemia  Renal calculi  Hypernatremia  Obstructive uropathy   The IV fluids were changed earlier to half-normal saline with bicarbonate added in an attempt to decrease the sodium load and to decrease the patient's serum potassium. However this afternoon, his serum sodium has increased. He is undergoing a urological procedure  Dr. Jerre Simon this afternoon. It is likely IV fluids will be changed. He will certainly need less of a sodium load. Gentle Lasix was also started to decrease his serum potassium. We'll continue to monitor her the patient's renal function and electrolytes post urological  procedure. The patient's nausea and vomiting have resolved.  Plan:  Continue IV fluid hydration with hypotonic IV fluids. Continue to monitor his electrolytes and adjust IV fluids accordingly. Continue Lasix until serum potassium normalizes. Continue supportive treatment.    LOS: 1 day   Jezel Basto 02/08/2012, 5:37 PM

## 2012-02-08 NOTE — Discharge Summary (Deleted)
NAMEBILLY, [name]             ACCOUNT NO.:  [id number]  MEDICAL RECORD NO.:  [id number]  LOCATION:                                 FACILITY:  PHYSICIAN:  Ky Barban, M.D.DATE OF BIRTH:  09-19-1951  DATE OF ADMISSION:  02/07/2012 DATE OF DISCHARGE:  LH                              DISCHARGE SUMMARY   CHIEF COMPLAINT:  Nausea, vomiting, not feeling well, bilateral ureteral calculi.  HISTORY:  A 61 year old gentleman came to the emergency room with pain in his right flank and also complaining that he has lost appetite, having nausea and vomiting for the last several days.  He was in the emergency room in March of this year.  CT at that time showed bilateral ureteral calculi.  He was readmitted today because he was having pain on the right side with nausea, vomiting, and CT was done again. The stone which was in the distal left ureter is gone but he still has moderate left hydronephrosis.  No hydronephrosis on the right side but there are couple of stones.  There is 1 stone in the right ureterovesical junction, 1 stone in the upper ureter or renal pelvis.  The right kidney is atrophic.  He was evaluated in the emergency room.  He also has a history of having hypertension and B12 deficiency.  He was found to be in renal failure.  His creatinine is up to 5.75.  His potassium was 6.2, BUN is 74, so he was given Kayexalate 30 g p.o. in the ER and we are going to repeat his labs this after known.  I was called in to see him because of ureteral calculi and renal calculus.  PAST MEDICAL HISTORY:  Includes hypertension, B12 deficiency, umbilical hernia.  History of having kidney stones.  He says I have seen him many years ago, did a lithotripsy on him in [place] but I have not seen him since.  Never had any surgery.  PERSONAL HISTORY:  Does not smoke or drink.  REVIEW OF SYSTEMS:  Unremarkable.  PHYSICAL EXAMINATION:  GENERAL:  Moderately built male, fully  conscious, alert, oriented. VITAL SIGNS:  Blood pressure 122/88, pulse 91 per minute, temperature 97.6.  He is still making urine.  He says he has no voiding problem. ABDOMEN:  Soft, flat.  Liver, spleen, kidneys not palpable. EXTERNAL GENITALIA:  Normal. RECTAL:  Deferred. EXTREMITIES:  Normal.  IMPRESSION:  Bilateral renal and right ureteral calculus with bilateral hydronephrosis, more on the left than the right.  LABORATORY DATA:  Sodium is 140, potassium 6.2, chloride 111, CO2 is 17, glucose 120, BUN is 74, creatinine 5.75, calcium 10.1.  The 10 mm stone which was seen in March CT is not visible anymore.  He probably passed it, but there is still hydroureter on the left side, although the left hydronephrosis is less as to previous exam.  IMPRESSION:  Bilateral renal right ureteral calculus, renal failure.  I think before I work him up further, I want to go ahead and put stents on both sides and see what happens because his creatinine is 5.75.  We already gave him some more Kayexalate and I will do it in the morning under anesthesia.  I discussed with the patient and his family.  They understand and want me to proceed.     Ky Barban, M.D.     MIJ/MEDQ  D:  02/07/2012  T:  02/08/2012  Job:  [id number]

## 2012-02-08 NOTE — Anesthesia Procedure Notes (Signed)
Spinal  Patient location during procedure: OR Start time: 02/08/2012 5:18 PM Staffing CRNA/Resident: Glynn Octave E Preanesthetic Checklist Completed: patient identified, site marked, surgical consent, pre-op evaluation, timeout performed, IV checked, risks and benefits discussed and monitors and equipment checked Spinal Block Patient position: right lateral decubitus Prep: Betadine Patient monitoring: heart rate, cardiac monitor, continuous pulse ox and blood pressure Approach: right paramedian Location: L3-4 Injection technique: single-shot Needle Needle type: Spinocan  Needle gauge: 22 G Needle length: 9 cm Assessment Sensory level: T8 Additional Notes ATTEMPTS:1 TRAY ZO:[id number] TRAY EXPIRATION DATE: 07 / 2014

## 2012-02-08 NOTE — Brief Op Note (Signed)
02/07/2012 - 02/08/2012  6:17 PM  PATIENT:  [name]  61 y.o. male  PRE-OPERATIVE DIAGNOSIS:  bilateral kidney stones  POST-OPERATIVE DIAGNOSIS:  bilateral kidney stones  PROCEDURE:  Procedure(s) (LRB): CYSTOSCOPY WITH RETROGRADE PYELOGRAM/URETERAL STENT PLACEMENT (Bilateral)  SURGEON:  Surgeon(s) and Role:    * Ky Barban, MD - Primary  PHYSICIAN ASSISTANT:   ASSISTANTS: none   ANESTHESIA:   spinal  EBL:  Total I/O In: 500 [I.V.:500] Out: 700 [Urine:700]  BLOOD ADMINISTERED:none  DRAINS: Urinary Catheter (Foley)   LOCAL MEDICATIONS USED:  NONE  SPECIMEN:  No Specimen  DISPOSITION OF SPECIMEN:  N/A  COUNTS:  YES  TOURNIQUET:  * No tourniquets in log *  DICTATION: .Other Dictation: Dictation Number dictation # [id number]  PLAN OF CARE: Admit to inpatient   PATIENT DISPOSITION:  PACU - hemodynamically stable.   Delay start of Pharmacological VTE agent (>24hrs) due to surgical blood loss or risk of bleeding:

## 2012-02-08 NOTE — Transfer of Care (Signed)
Immediate Anesthesia Transfer of Care Note  Patient: [name]  Procedure(s) Performed: Procedure(s) (LRB): CYSTOSCOPY WITH RETROGRADE PYELOGRAM/URETERAL STENT PLACEMENT (Bilateral)  Patient Location: PACU  Anesthesia Type: Spinal  Level of Consciousness: awake, alert  and oriented  Airway & Oxygen Therapy: Patient Spontanous Breathing and Patient connected to nasal cannula oxygen  Post-op Assessment: Report given to PACU RN  Post vital signs: Reviewed and stable  Complications: No apparent anesthesia complications

## 2012-02-08 NOTE — Progress Notes (Signed)
Awaiting MD, report given to S. Sparks Charity fundraiser. Family and pt notified of MD delay.

## 2012-02-08 NOTE — Anesthesia Postprocedure Evaluation (Signed)
  Anesthesia Post-op Note  Patient: [name]  Procedure(s) Performed: Procedure(s) (LRB): CYSTOSCOPY WITH RETROGRADE PYELOGRAM/URETERAL STENT PLACEMENT (Bilateral)  Patient Location: PACU  Anesthesia Type: Spinal  Level of Consciousness: awake, alert  and oriented  Airway and Oxygen Therapy: Patient Spontanous Breathing and Patient connected to nasal cannula oxygen  Post-op Pain: none  Post-op Assessment: Post-op Vital signs reviewed, Patient's Cardiovascular Status Stable, Respiratory Function Stable and Patent Airway  Nausea present, treated with Zofran.  Post-op Vital Signs: Reviewed and stable  Complications: No apparent anesthesia complications

## 2012-02-09 ENCOUNTER — Encounter (HOSPITAL_COMMUNITY): Payer: Self-pay | Admitting: Internal Medicine

## 2012-02-09 DIAGNOSIS — D649 Anemia, unspecified: Secondary | ICD-10-CM | POA: Diagnosis present

## 2012-02-09 DIAGNOSIS — R310 Gross hematuria: Secondary | ICD-10-CM

## 2012-02-09 HISTORY — DX: Gross hematuria: R31.0

## 2012-02-09 LAB — BASIC METABOLIC PANEL
CO2: 21 mEq/L (ref 19–32)
Calcium: 9.6 mg/dL (ref 8.4–10.5)
GFR calc Af Amer: 20 mL/min — ABNORMAL LOW (ref >90)
GFR calc non Af Amer: 17 mL/min — ABNORMAL LOW (ref >90)
Sodium: 145 mEq/L (ref 135–145)

## 2012-02-09 LAB — CBC
Platelets: 308 10*3/uL (ref 150–400)
RBC: 3.93 MIL/uL — ABNORMAL LOW (ref 4.22–5.81)
WBC: 7.1 10*3/uL (ref 4.0–10.5)

## 2012-02-09 MED ORDER — ALBUTEROL SULFATE (5 MG/ML) 0.5% IN NEBU
2.5000 mg | INHALATION_SOLUTION | Freq: Two times a day (BID) | RESPIRATORY_TRACT | Status: DC
  Administered 2012-02-09 – 2012-02-10 (×3): 2.5 mg via RESPIRATORY_TRACT
  Filled 2012-02-09 (×4): qty 0.5

## 2012-02-09 MED ORDER — SODIUM CHLORIDE 0.9 % IN NEBU
INHALATION_SOLUTION | RESPIRATORY_TRACT | Status: AC
  Administered 2012-02-09: 3 mL
  Filled 2012-02-09: qty 3

## 2012-02-09 MED ORDER — SODIUM BICARBONATE 8.4 % IV SOLN
INTRAVENOUS | Status: AC
  Filled 2012-02-09: qty 100

## 2012-02-09 MED ORDER — INSULIN ASPART 100 UNIT/ML ~~LOC~~ SOLN
10.0000 [IU] | Freq: Once | SUBCUTANEOUS | Status: AC
  Administered 2012-02-09: 10 [IU] via SUBCUTANEOUS

## 2012-02-09 MED ORDER — DEXTROSE 50 % IV SOLN
1.0000 | Freq: Once | INTRAVENOUS | Status: AC
  Administered 2012-02-09: 50 mL via INTRAVENOUS
  Filled 2012-02-09: qty 50

## 2012-02-09 MED ORDER — SODIUM CHLORIDE 0.9 % IR SOLN
Status: DC | PRN
  Administered 2012-02-08: 3000 mL

## 2012-02-09 MED ORDER — SODIUM BICARBONATE 8.4 % IV SOLN
INTRAVENOUS | Status: DC
  Administered 2012-02-09 – 2012-02-10 (×3): via INTRAVENOUS
  Filled 2012-02-09 (×13): qty 1000

## 2012-02-09 NOTE — Addendum Note (Signed)
Addendum  created 02/09/12 1610 by Moshe Salisbury, CRNA   Modules edited:Anesthesia Events, Anesthesia Medication Administration

## 2012-02-09 NOTE — Op Note (Signed)
[name], [name]             ACCOUNT NO.:  [id number]  MEDICAL RECORD NO.:  [id number]  LOCATION:  A212                          FACILITY:  [facility]  PHYSICIAN:  Ky Barban, M.D.DATE OF BIRTH:  Mar 20, 1951  DATE OF PROCEDURE:  02/08/2012 DATE OF DISCHARGE:                              OPERATIVE REPORT   PREOPERATIVE DIAGNOSIS:  Renal failure, bilateral ureteral calculi.  ANESTHESIA:  Spinal.  PROCEDURE:  The patient under spinal anesthesia in lithotomy position, usual prep and drape, #25 cystoscope was introduced into the bladder. It is inspected, looks normal.  Left ureteral orifice catheterized with a wedge catheter.  Hypaque was injected.  Under fluoroscopic control, dye goes up into the renal pelvis, which is dilated.  I did not see any filling defect in the ureter.  I used the wedge catheter, injected the dye on the right side in the same fashion.  The dye goes up into the ureter.  This ureter looks normal caliber, but near the renal pelvis or inside of the renal pelvis, I see there is a filling defect.  There is a stone which is blocking the middle pole calyx.  All 3 calices are club shape.  Middle calyx ,upper pole calyx, and lower pole calyx were clubbed.  I tried to put a guidewire through the open-end catheter, which I inserted but the guidewire goes up into the upper pole calyx, but will not go into the middle calyx, looks like that is being blocked with the stone.  So, I decided to leave the sent and the double-J stent in the renal pelvis.  The guidewire was pushed into the renal pelvis and then a 5-French 24-cm double-J stent was positioned over the guidewire under fluoroscopic control.  Upper end of the double-J stent is in the renal pelvis, and the guidewire was removed.  The nice loop was obtained in the renal pelvis and the bladder.  Similarly on the left side, I put an open-end catheter into the renal pelvis.  The guidewire goes up into the upper pole  calyx.  A double-J stent was positioned between the upper pole calyx and the bladder.  Nice loop was obtained in the upper pole calyx and in the bladder.  The strings from the stents have been removed.  All the guidewire and all instruments were removed and again under fluoroscopy, I can see the position of the upper end of the both stents looks to be in the proper position.  Similarly in the bladder, seems to be nice looped in the bladder and I left a Foley catheter.  The patient left the operating room in satisfactory condition.     Ky Barban, M.D.     MIJ/MEDQ  D:  02/08/2012  T:  02/09/2012  Job:  [id number]

## 2012-02-09 NOTE — Anesthesia Postprocedure Evaluation (Signed)
  Anesthesia Post-op Note  Patient: [name]  Procedure(s) Performed: Procedure(s) (LRB): CYSTOSCOPY WITH RETROGRADE PYELOGRAM/URETERAL STENT PLACEMENT (Bilateral)  Patient Location: Room 212  Anesthesia Type: Spinal  Level of Consciousness: awake, alert , oriented and patient cooperative  Airway and Oxygen Therapy: Patient Spontanous Breathing  Post-op Pain: mild  Post-op Assessment: Post-op Vital signs reviewed, Patient's Cardiovascular Status Stable and Respiratory Function Stable  Post-op Vital Signs: Reviewed and stable  Complications: No apparent anesthesia complications

## 2012-02-09 NOTE — Progress Notes (Signed)
Subjective: (The patient was seen this morning. Delayed entry.) He has no complaints of abdominal pain, nausea, vomiting, or bilateral flank pain.  Objective: Vital signs in last 24 hours: Filed Vitals:   02/09/12 0522 02/09/12 0843 02/09/12 1500 02/09/12 1603  BP: 130/76  136/81 127/80  Pulse: 89  81 76  Temp: 98 F (36.7 C)  98 F (36.7 C) 98.2 F (36.8 C)  TempSrc: Oral  Oral Oral  Resp: 18  18 20   Height:      Weight:      SpO2: 98% 95% 98% 98%    Intake/Output Summary (Last 24 hours) at 02/09/12 1817 Last data filed at 02/09/12 1756  Gross per 24 hour  Intake   4667 ml  Output   2850 ml  Net   1817 ml    Weight change:   General: Pleasant 61 year old Latin American man sitting up in bed 61, in no acute distress. Lungs: Clear to auscultation bilaterally. Heart: S1, S2, with no murmurs rubs or gallops. Abdomen: Positive bowel sounds, soft, nontender, nondistended. No CVA tenderness. GU: Grossly bloody urine in the Foley bag. Extremities: No pedal edema.  Lab Results: Basic Metabolic Panel:  Basename 02/09/12 0435 02/08/12 1349 02/08/12 0531  NA 145 151* --  K 5.9* 5.2* --  CL 117* -- 118*  CO2 21 -- 21  GLUCOSE 111* 101* --  BUN 46* -- 62*  CREATININE 3.55* -- 4.47*  CALCIUM 9.6 -- 9.2  MG -- -- --  PHOS -- -- --   Liver Function Tests:  [facility] 02/07/12 0654  AST 15  ALT 19  ALKPHOS 27*  BILITOT 0.2*  PROT 8.2  ALBUMIN 4.0    Basename 02/07/12 0654  LIPASE 71*  AMYLASE --   No results found for this basename: AMMONIA:2 in the last 72 hours CBC:  Basename 02/09/12 0435 02/08/12 1349 02/08/12 0531  WBC 7.1 -- 4.4  NEUTROABS -- -- --  HGB 11.6* 12.9* --  HCT 36.7* 38.0* --  MCV 93.4 -- 93.1  PLT 308 -- 295   Cardiac Enzymes: No results found for this basename: CKTOTAL:3,CKMB:3,CKMBINDEX:3,TROPONINI:3 in the last 72 hours BNP: No results found for this basename: PROBNP:3 in the last 72 hours D-Dimer: No results found for this basename:  DDIMER:2 in the last 72 hours CBG: No results found for this basename: GLUCAP:6 in the last 72 hours Hemoglobin A1C: No results found for this basename: HGBA1C in the last 72 hours Fasting Lipid Panel: No results found for this basename: CHOL,HDL,LDLCALC,TRIG,CHOLHDL,LDLDIRECT in the last 72 hours Thyroid Function Tests: No results found for this basename: TSH,T4TOTAL,FREET4,T3FREE,THYROIDAB in the last 72 hours Anemia Panel: No results found for this basename: VITAMINB12,FOLATE,FERRITIN,TIBC,IRON,RETICCTPCT in the last 72 hours Coagulation: No results found for this basename: LABPROT:2,INR:2 in the last 72 hours Urine Drug Screen: Drugs of Abuse  No results found for this basename: labopia,  cocainscrnur,  labbenz,  amphetmu,  thcu,  labbarb    Alcohol Level: No results found for this basename: ETH:2 in the last 72 hours Urinalysis:  Basename 02/07/12 1719  COLORURINE STRAW*  LABSPEC 1.010  PHURINE 5.5  GLUCOSEU NEGATIVE  HGBUR SMALL*  BILIRUBINUR NEGATIVE  KETONESUR NEGATIVE  PROTEINUR NEGATIVE  UROBILINOGEN 0.2  NITRITE NEGATIVE  LEUKOCYTESUR NEGATIVE   Misc. Labs:   Micro: Recent Results (from the past 240 hour(s))  SURGICAL PCR SCREEN     Status: Normal   Collection Time   02/07/12  7:37 PM      Component Value Range  Status Comment   MRSA, PCR NEGATIVE  NEGATIVE  Final    Staphylococcus aureus NEGATIVE  NEGATIVE  Final     Studies/Results: Dg Retrograde Pyelogram  02/09/2012  *RADIOLOGY REPORT*  Clinical Data: Bilateral stent placement and stone removal.  RETROGRADE PYELOGRAM  Comparison: None.  Findings: Fluoroscopic images are submitted from a retrograde urethrogram.  The left ureter is cannulated.  The left ureter is dilated.  Grade III hydronephrosis is present with blunting of the calyces.  The right ureter is cannulated.  The ureter is of more normal caliber. Grade III hydronephrosis is present on the right as well. There is a focal filling defect in the  proximal right ureter which may represent an obstructing stone.  Spot images demonstrate bilateral ureteral stents.  The right sided upper loop was formed in the pelvis.  Both loops are formed in the urinary bladder.  The only image provided of the left kidney shows a non looped stent extending into the upper pole calix.  IMPRESSION:  1.  Bilateral grade 3 hydronephrosis. 2.  Interval placement of bilateral double J ureteral stents. There is no image provided of the fully formed loop in the left kidney.  Original Report Authenticated By: Jamesetta Orleans. MATTERN, M.D.    Medications: I have reviewed the patient's current medications.  Assessment: Active Problems:  Nausea & vomiting  AKI (acute kidney injury)  Hyperkalemia  Renal calculi  Hypernatremia  Obstructive uropathy  Gross hematuria  Anemia   1. Obstructive uropathy/nephrolithiasis.. The patient is postoperative day #1, cystoscopy with retrograde pyelogram and bilateral ureteral stent placements. He has postoperative gross hematuria which is not unusual.  Acute renal injury/acute renal failure. This is secondary to obstruction. His renal function is improving as noted by his decreased creatinine.  Hypernatremia secondary to volume depletion and acute renal failure. Resolving with hypotonic IV fluids.  Hyperkalemia. His serum potassium has increased. He received a gentle bicarbonate drip yesterday but it was discontinued postoperatively. He is being given Kayexalate as well.  Anemia, likely secondary to gross hematuria and acute renal failure.    Plan:   1. Change IV fluids to dextrose with bicarbonate added to try to decrease his serum potassium. Also gave him an amp of D50 followed by insulin. We'll also start albuterol nebulizations twice a day. We'll continue to monitor daily.  Continue supportive treatment.    LOS: 2 days   Tamas Suen 02/09/2012, 6:17 PM

## 2012-02-09 NOTE — Addendum Note (Signed)
Addendum  created 02/09/12 1610 by Marolyn Hammock, CRNA   Modules edited:Notes Section

## 2012-02-10 DIAGNOSIS — I10 Essential (primary) hypertension: Secondary | ICD-10-CM | POA: Diagnosis present

## 2012-02-10 LAB — CBC
HCT: 36.7 % — ABNORMAL LOW (ref 39.0–52.0)
MCV: 92.7 fL (ref 78.0–100.0)
RBC: 3.96 MIL/uL — ABNORMAL LOW (ref 4.22–5.81)
WBC: 6.5 10*3/uL (ref 4.0–10.5)

## 2012-02-10 LAB — BASIC METABOLIC PANEL
CO2: 23 mEq/L (ref 19–32)
Chloride: 106 mEq/L (ref 96–112)
GFR calc Af Amer: 22 mL/min — ABNORMAL LOW (ref >90)
Potassium: 4.6 mEq/L (ref 3.5–5.1)
Sodium: 140 mEq/L (ref 135–145)

## 2012-02-10 MED ORDER — DILTIAZEM HCL 60 MG PO TABS
120.0000 mg | ORAL_TABLET | Freq: Every day | ORAL | Status: DC
  Administered 2012-02-10: 120 mg via ORAL
  Filled 2012-02-10: qty 2

## 2012-02-10 MED ORDER — MORPHINE SULFATE 4 MG/ML IJ SOLN: 4.0000 mg | INTRAMUSCULAR | Status: DC | PRN

## 2012-02-10 MED ORDER — HYDROCODONE-ACETAMINOPHEN 5-325 MG PO TABS: 1.0000 | ORAL_TABLET | ORAL | Status: AC | PRN

## 2012-02-10 MED ORDER — DSS 100 MG PO CAPS: 100.0000 mg | ORAL_CAPSULE | Freq: Two times a day (BID) | ORAL | Status: DC

## 2012-02-10 MED ORDER — DILTIAZEM HCL 120 MG PO TABS: 120.0000 mg | ORAL_TABLET | Freq: Every day | ORAL | Status: DC

## 2012-02-10 NOTE — Progress Notes (Signed)
Subjective: The patient is having intermittent bilateral flank pain. He denies chest pain, nausea, or vomiting.  Objective: Vital signs in last 24 hours: Filed Vitals:   02/09/12 1944 02/09/12 2353 02/10/12 0444 02/10/12 0912  BP: 165/113 153/89 160/102   Pulse: 119 87 95   Temp: 97.6 F (36.4 C) 97.3 F (36.3 C) 97.8 F (36.6 C)   TempSrc: Oral Oral Oral   Resp: 20 20 22    Height:      Weight:      SpO2: 99% 99% 99% 98%    Intake/Output Summary (Last 24 hours) at 02/10/12 1044 Last data filed at 02/10/12 1003  Gross per 24 hour  Intake 4303.33 ml  Output   2550 ml  Net 1753.33 ml    Weight change:   General: Pleasant 61 year old Latin American man sitting up in bed, in no acute distress. Lungs: Clear to auscultation bilaterally. Heart: S1, S2, with no murmurs rubs or gallops. Abdomen: Positive bowel sounds, soft, mild bilateral CVA tenderness. GU: Grossly bloody urine in the Foley bag. Extremities: No pedal edema.  Lab Results: Basic Metabolic Panel:  Basename 02/10/12 0435 02/09/12 0435  NA 140 145  K 4.6 5.9*  CL 106 117*  CO2 23 21  GLUCOSE 135* 111*  BUN 36* 46*  CREATININE 3.33* 3.55*  CALCIUM 9.7 9.6  MG -- --  PHOS -- --   Liver Function Tests: No results found for this basename: AST:2,ALT:2,ALKPHOS:2,BILITOT:2,PROT:2,ALBUMIN:2 in the last 72 hours No results found for this basename: LIPASE:2,AMYLASE:2 in the last 72 hours No results found for this basename: AMMONIA:2 in the last 72 hours CBC:  Basename 02/10/12 0435 02/09/12 0435  WBC 6.5 7.1  NEUTROABS -- --  HGB 12.0* 11.6*  HCT 36.7* 36.7*  MCV 92.7 93.4  PLT 289 308   Cardiac Enzymes: No results found for this basename: CKTOTAL:3,CKMB:3,CKMBINDEX:3,TROPONINI:3 in the last 72 hours BNP: No results found for this basename: PROBNP:3 in the last 72 hours D-Dimer: No results found for this basename: DDIMER:2 in the last 72 hours CBG: No results found for this basename: GLUCAP:6 in the  last 72 hours Hemoglobin A1C: No results found for this basename: HGBA1C in the last 72 hours Fasting Lipid Panel: No results found for this basename: CHOL,HDL,LDLCALC,TRIG,CHOLHDL,LDLDIRECT in the last 72 hours Thyroid Function Tests: No results found for this basename: TSH,T4TOTAL,FREET4,T3FREE,THYROIDAB in the last 72 hours Anemia Panel: No results found for this basename: VITAMINB12,FOLATE,FERRITIN,TIBC,IRON,RETICCTPCT in the last 72 hours Coagulation: No results found for this basename: LABPROT:2,INR:2 in the last 72 hours Urine Drug Screen: Drugs of Abuse  No results found for this basename: labopia,  cocainscrnur,  labbenz,  amphetmu,  thcu,  labbarb    Alcohol Level: No results found for this basename: ETH:2 in the last 72 hours Urinalysis:  Basename 02/07/12 1719  COLORURINE STRAW*  LABSPEC 1.010  PHURINE 5.5  GLUCOSEU NEGATIVE  HGBUR SMALL*  BILIRUBINUR NEGATIVE  KETONESUR NEGATIVE  PROTEINUR NEGATIVE  UROBILINOGEN 0.2  NITRITE NEGATIVE  LEUKOCYTESUR NEGATIVE   Misc. Labs:   Micro: Recent Results (from the past 240 hour(s))  SURGICAL PCR SCREEN     Status: Normal   Collection Time   02/07/12  7:37 PM      Component Value Range Status Comment   MRSA, PCR NEGATIVE  NEGATIVE  Final    Staphylococcus aureus NEGATIVE  NEGATIVE  Final     Studies/Results: Dg Retrograde Pyelogram  02/09/2012  *RADIOLOGY REPORT*  Clinical Data: Bilateral stent placement and stone removal.  RETROGRADE PYELOGRAM  Comparison: None.  Findings: Fluoroscopic images are submitted from a retrograde urethrogram.  The left ureter is cannulated.  The left ureter is dilated.  Grade III hydronephrosis is present with blunting of the calyces.  The right ureter is cannulated.  The ureter is of more normal caliber. Grade III hydronephrosis is present on the right as well. There is a focal filling defect in the proximal right ureter which may represent an obstructing stone.  Spot images demonstrate  bilateral ureteral stents.  The right sided upper loop was formed in the pelvis.  Both loops are formed in the urinary bladder.  The only image provided of the left kidney shows a non looped stent extending into the upper pole calix.  IMPRESSION:  1.  Bilateral grade 3 hydronephrosis. 2.  Interval placement of bilateral double J ureteral stents. There is no image provided of the fully formed loop in the left kidney.  Original Report Authenticated By: Jamesetta Orleans. MATTERN, M.D.    Medications: I have reviewed the patient's current medications.  Assessment: Active Problems:  Nausea & vomiting  AKI (acute kidney injury)  Hyperkalemia  Renal calculi  Hypernatremia  Obstructive uropathy  Gross hematuria  Anemia   1. Obstructive uropathy/nephrolithiasis.. The patient is postoperative day #1, cystoscopy with retrograde pyelogram and bilateral ureteral stent placements. He has postoperative gross hematuria which is not unusual.  Acute renal injury/acute renal failure. This is secondary to obstruction. His renal function is improving as noted by his decreased creatinine. Lisinopril is being held due to hyperkalemia and acute renal failure.  Hypernatremia secondary to volume depletion and acute renal failure. Resolving with hypotonic IV fluids.  Hyperkalemia. His serum potassium has decreased, status post appropriate treatment. We'll continue gentle bicarbonate drip.  Anemia, likely secondary to gross hematuria and acute renal failure.  Hypertension. His blood pressure has progressively increased off of lisinopril.    Plan:  1. Continue IV fluids as ordered. 2. Will start Cardizem for treatment of hypertension. 3. Followup recommendations by Dr. Jerre Simon pending.   LOS: 3 days   Shant Hence 02/10/2012, 10:44 AM

## 2012-02-10 NOTE — Progress Notes (Signed)
   CARE MANAGEMENT NOTE 02/10/2012  Patient:  [name], [name]   Account Number:  [id number]  Date Initiated:  02/10/2012  Documentation initiated by:  Rosemary Holms  Subjective/Objective Assessment:   Pt admitted from home with spouse. amitted with chills.     Action/Plan:   Spoke with patient at bedside. Feeling better. States he can not think of anything HH or DME needs. He also stated he was so appreciative of the kindness from the staff.   Anticipated DC Date:  02/11/2012   Anticipated DC Plan:  HOME/SELF CARE      DC Planning Services  CM consult      Choice offered to / List presented to:             Status of service:  In process, will continue to follow Medicare Important Message given?   (If response is "NO", the following Medicare IM given date fields will be blank) Date Medicare IM given:   Date Additional Medicare IM given:    Discharge Disposition:    Per UR Regulation:    If discussed at Long Length of Stay Meetings, dates discussed:    Comments:  02/10/12 1000 Doneta Bayman Leanord Hawking RN BSN CM

## 2012-02-10 NOTE — Progress Notes (Signed)
Call Dr. Jerre Simon office twice and left message for follow up appointment. Scheduled appointment @ [facility] for 02/17/12 @ 845am. Foley leg bag in place and waiting for Dr. Sherrie Mustache to follow up prior to discharge.

## 2012-02-10 NOTE — H&P (Signed)
Physician Discharge Summary  LEOCADIO HEAL MRN: [id number] DOB/AGE: [birth year] 61 y.o.  PCP: Carmelina Dane   Admit date: 02/07/2012 Discharge date: 02/10/2012  Discharge Diagnoses:  1. Obstructive uropathy. Status post cystoscopy with retrograde pyelogram/bilateral ureteral stent placement. 2. Nephrolithiasis. 3. Acute renal failure/acute renal injury secondary to obstructive uropathy. The patient's creatinine was 5.75 on admission and 3.33 at the time of discharge. 4. Hyperkalemia secondary to acute renal failure and in the setting of ACE inhibitor therapy. The patient's serum potassium was 6. 2 on admission and 4.6 at the time of discharge. 5. Hypernatremia. Resolved. The patient is serum sodium was 140 at the time of discharge. 6. Malignant hypertension, off of lisinopril. Cardizem was started and may need to be titrated upward. 7. Postoperative gross hematuria. 8. Normocytic anemia. The patient's hemoglobin was 12.0 at the time of discharge. He has a history of vitamin B12 deficiency. 9. Metabolic acidosis secondary to acute renal failure. Resolved.    Medication List  As of 02/10/2012  4:08 PM   STOP taking these medications         lisinopril 20 MG tablet         TAKE these medications         cyanocobalamin 1000 MCG/ML injection   Commonly known as: (VITAMIN B-12)   Inject 1,000 mcg into the muscle every 30 (thirty) days.      diltiazem 120 MG tablet   Commonly known as: CARDIZEM   Take 1 tablet (120 mg total) by mouth daily. FOR HIGH BLOOD PRESSURE.      DSS 100 MG Caps   Take 100 mg by mouth 2 (two) times daily. STOOL SOFTENER.      HYDROcodone-acetaminophen 5-325 MG per tablet   Commonly known as: NORCO   Take 1-2 tablets by mouth every 4 (four) hours as needed.            Discharge Condition: Improved and stable.  Disposition: Final discharge disposition not confirmed   Consults: Alleen Borne, M.D.   Significant Diagnostic Studies: Ct  Abdomen Pelvis Wo Contrast  02/07/2012  *RADIOLOGY REPORT*  Clinical Data: Nausea.  Recent passage of kidney stones.  CT ABDOMEN AND PELVIS WITHOUT CONTRAST  Technique:  Multidetector CT imaging of the abdomen and pelvis was performed following the standard protocol without intravenous contrast.  Comparison: 01/17/2012  Findings: A left kidney lower pole nonobstructive calculus measures 1.8 x 1.2 cm on image 32 of series 2.  Right renal atrophy noted.  A 0.9 x 0.5 cm right collecting system calculus is present and there is a 0.5 cm right UVJ calculus which currently appears nonobstructive.  There is reduced hydronephrosis on the left compared the prior exam.  There is continued left hydroureter, that the large left distal ureteral calculus shown on the prior exam is no longer readily apparent.  There is a transitional in left ureteral caliber in the distal ureter.  The right-sided previously seen distal ureteral calculus is no longer visible. 10.  No calculus is visible in the urinary bladder.  Stable fullness of the lateral limb of the left adrenal gland noted.  An umbilical hernia contains adipose tissue.  The appendix appears normal. Bridging spurring of the sacroiliac joints noted.  Disc bulges noted at the L3-4, L4-5, and L5-S1 levels.  IMPRESSION:  1.  The previously seen distal ureteral calculi are no longer readily apparent.  The left hydronephrosis has resolved, but there is continued left hydroureter extending down to the distal  ureter. 2.  Large left kidney lower pole nonobstructive calculus, stable. 3.  5 mm right UVJ calculus is not currently causing hydronephrosis.  There is also a 9 mm right collecting system calculus. 4.  Umbilical hernia contains adipose tissue. 5.  Lower lumbar spondylosis.  Original Report Authenticated By: Dellia Cloud, M.D.   Ct Abdomen Pelvis Wo Contrast  01/17/2012  *RADIOLOGY REPORT*  Clinical Data: Hematuria, left flank pain.  CT ABDOMEN AND PELVIS WITHOUT  CONTRAST  Technique:  Multidetector CT imaging of the abdomen and pelvis was performed following the standard protocol without intravenous contrast.  Comparison: None  Findings: Lung bases are clear.  No effusions.  Heart is normal size.  There is moderate left hydronephrosis and hydroureter.  10 mm distal left ureteral stones.  Right kidney is atrophic.  6 mm stone is layering dependently in the right renal pelvis.  Despite right hydronephrosis, there is a 5 mm distal right ureteral stone. Bilateral nephrolithiasis.  15 mm stone in the lower pole of the left kidney.  Diffuse fatty infiltration of the liver.  Spleen, pancreas, adrenals and gallbladder are unremarkable.  There is an umbilical hernia containing fat.  Appendix is visualized and is normal. Bowel grossly unremarkable.  No free fluid, free air, or adenopathy.  IMPRESSION: Bilateral distal ureteral stones, 10 mm stone in the distal left ureter and 5 mm stone in the distal right ureter.  Moderate left hydronephrosis.  No hydronephrosis on the right.  The right kidney is atrophic.  Bilateral nephrolithiasis.  Fatty liver.  Umbilical hernia containing fat.  Original Report Authenticated By: Cyndie Chime, M.D.   Dg Retrograde Pyelogram  02/09/2012  *RADIOLOGY REPORT*  Clinical Data: Bilateral stent placement and stone removal.  RETROGRADE PYELOGRAM  Comparison: None.  Findings: Fluoroscopic images are submitted from a retrograde urethrogram.  The left ureter is cannulated.  The left ureter is dilated.  Grade III hydronephrosis is present with blunting of the calyces.  The right ureter is cannulated.  The ureter is of more normal caliber. Grade III hydronephrosis is present on the right as well. There is a focal filling defect in the proximal right ureter which may represent an obstructing stone.  Spot images demonstrate bilateral ureteral stents.  The right sided upper loop was formed in the pelvis.  Both loops are formed in the urinary bladder.  The only  image provided of the left kidney shows a non looped stent extending into the upper pole calix.  IMPRESSION:  1.  Bilateral grade 3 hydronephrosis. 2.  Interval placement of bilateral double J ureteral stents. There is no image provided of the fully formed loop in the left kidney.  Original Report Authenticated By: Jamesetta Orleans. MATTERN, M.D.     Microbiology: Recent Results (from the past 240 hour(s))  SURGICAL PCR SCREEN     Status: Normal   Collection Time   02/07/12  7:37 PM      Component Value Range Status Comment   MRSA, PCR NEGATIVE  NEGATIVE  Final    Staphylococcus aureus NEGATIVE  NEGATIVE  Final      Labs: Results for orders placed during the hospital encounter of 02/07/12 (from the past 48 hour(s))  BASIC METABOLIC PANEL     Status: Abnormal   Collection Time   02/09/12  4:35 AM      Component Value Range Comment   Sodium 145  135 - 145 (mEq/L)    Potassium 5.9 (*) 3.5 - 5.1 (mEq/L)  Chloride 117 (*) 96 - 112 (mEq/L)    CO2 21  19 - 32 (mEq/L)    Glucose, Bld 111 (*) 70 - 99 (mg/dL)    BUN 46 (*) 6 - 23 (mg/dL)    Creatinine, Ser 1.61 (*) 0.50 - 1.35 (mg/dL)    Calcium 9.6  8.4 - 10.5 (mg/dL)    GFR calc non Af Amer 17 (*) >90 (mL/min)    GFR calc Af Amer 20 (*) >90 (mL/min)   CBC     Status: Abnormal   Collection Time   02/09/12  4:35 AM      Component Value Range Comment   WBC 7.1  4.0 - 10.5 (K/uL)    RBC 3.93 (*) 4.22 - 5.81 (MIL/uL)    Hemoglobin 11.6 (*) 13.0 - 17.0 (g/dL)    HCT 09.6 (*) 04.5 - 52.0 (%)    MCV 93.4  78.0 - 100.0 (fL)    MCH 29.5  26.0 - 34.0 (pg)    MCHC 31.6  30.0 - 36.0 (g/dL)    RDW 40.9 (*) 81.1 - 15.5 (%)    Platelets 308  150 - 400 (K/uL)   BASIC METABOLIC PANEL     Status: Abnormal   Collection Time   02/10/12  4:35 AM      Component Value Range Comment   Sodium 140  135 - 145 (mEq/L)    Potassium 4.6  3.5 - 5.1 (mEq/L) DELTA CHECK NOTED   Chloride 106  96 - 112 (mEq/L) DELTA CHECK NOTED   CO2 23  19 - 32 (mEq/L)     Glucose, Bld 135 (*) 70 - 99 (mg/dL)    BUN 36 (*) 6 - 23 (mg/dL)    Creatinine, Ser 9.14 (*) 0.50 - 1.35 (mg/dL)    Calcium 9.7  8.4 - 10.5 (mg/dL)    GFR calc non Af Amer 19 (*) >90 (mL/min)    GFR calc Af Amer 22 (*) >90 (mL/min)   CBC     Status: Abnormal   Collection Time   02/10/12  4:35 AM      Component Value Range Comment   WBC 6.5  4.0 - 10.5 (K/uL)    RBC 3.96 (*) 4.22 - 5.81 (MIL/uL)    Hemoglobin 12.0 (*) 13.0 - 17.0 (g/dL)    HCT 78.2 (*) 95.6 - 52.0 (%)    MCV 92.7  78.0 - 100.0 (fL)    MCH 30.3  26.0 - 34.0 (pg)    MCHC 32.7  30.0 - 36.0 (g/dL)    RDW 21.3  08.6 - 57.8 (%)    Platelets 289  150 - 400 (K/uL)      HPI : The patient is a 61 year old man with a past medical history significant for hypertension, kidney stones, and vitamin B12 deficiency, who presented to the emergency department with nausea and a decreased appetite. In the emergency department, he was noted to be afebrile and hemodynamically stable. His lab data were significant for a serum potassium of 6.2, CO2 of 17, BUN of 74, creatinine of 5.75, and a lipase of 71. A CT of his abdomen and pelvis was ordered. The results revealed that the previously seen distal right ureteral calculi were no longer present and the left hydronephrosis had resolved but there was continued left hydroureter extending down to the distal ureter. There was a large left kidney lower pole nonobstructive calculus and a 5 mm right UVJ calculus is not causing hydronephrosis. There was also a 9  mm right collecting system calculus and lumbar spondylosis. He was admitted for further evaluation and management.  HOSPITAL COURSE: A urinalysis was ordered. It revealed no WBCs, 3-6 RBCs, and rare bacteria. The patient was started on IV fluid hydration. Lisinopril was discontinued. His nausea and vomiting was treated with as needed Zofran. Urologist, Dr. Jerre Simon was consulted. Following this evaluation, he recommended ureteral stent placement. The  patient underwent cystoscopy with retrograde pyelogram/ureteral stent placement bilaterally successfully. The Foley catheter was kept in place per the instructions by Dr. Jerre Simon. There was gross hematuria following the procedure which was anticipated. The patient's pain was treated with as needed analgesics both by mouth and IV.  To treat metabolic acidosis, hyperkalemia, and hypernatremia, his IV fluids were adjusted accordingly to include bicarbonate in either D5 or half normal saline. In addition, his hyperkalemia was treated with dextrose followed by insulin, albuterol nebulizers, Kayexalate, and Lasix. Over the course of the hospitalization, his electrolyte abnormalities improved. His renal function improved but did not return to baseline. It is expected that over the next few days to next week or 2, his renal function will return to baseline given that the obstructive uropathy was resolving. He also had mild anemia which was thought to be secondary to the dilutional effects of IV fluids and gross hematuria in the setting of chronic vitamin B12 deficiency.  His blood pressure increased progressively off the lisinopril. Therefore, Cardizem was started during the hospitalization. The dose may need to be titrated up in the outpatient setting over the next week or 2. If the patient's renal function normalizes, lisinopril may be restarted per the discretion of his primary care provider.    Discharge Exam: Blood pressure 160/102, pulse 95, temperature 97.8 F (36.6 C), temperature source Oral, resp. rate 22, height 5\' 10"  (1.778 m), weight 105.235 kg (232 lb), SpO2 98.00%.  Lungs: Clear to auscultation bilaterally. Heart: S1, S2, with no murmurs rubs or gallops. Abdomen: Positive bowel sounds, soft, mild bilateral CVA tenderness, no distention or masses palpated. Activities: No pedal edema.   Discharge Orders    Future Orders Please Complete By Expires   Diet - low sodium heart healthy       Increase activity slowly      Discharge instructions      Comments:   Avoid dehydration. Stop lisinopril.      Follow-up Information    Follow up with [facility]. (Your appointment is with Gennette Pac on  April 26 at 8:45am.  You will need to have blood work done to monitor kidney function and followup from hospital admission)    Contact information:   [phone]      Follow up with Alleen Borne I, MD in 2 weeks. (You will need to call on Monday to make your appointment for a 2 week followup.)    Contact information:   [address] [place] [address] [phone]           Total discharge time: 40 minutes.  Signed: Mystie Ormand 02/10/2012, 4:08 PM

## 2012-02-10 NOTE — Progress Notes (Signed)
Discharge instructions and prescriptions given, verbalized understanding, out in stable condition with foley attached to leg bag via w/c with staff.

## 2012-02-12 LAB — STONE ANALYSIS: Stone Weight KSTONE: 0.455 g

## 2012-02-14 ENCOUNTER — Encounter (HOSPITAL_COMMUNITY): Payer: Self-pay | Admitting: Urology

## 2012-02-21 ENCOUNTER — Encounter (HOSPITAL_COMMUNITY): Payer: Self-pay | Admitting: *Deleted

## 2012-02-21 ENCOUNTER — Inpatient Hospital Stay (HOSPITAL_COMMUNITY)
Admission: EM | Admit: 2012-02-21 | Discharge: 2012-02-26 | DRG: 683 | Disposition: A | Discharge status: Home / Self Care | Payer: PRIVATE HEALTH INSURANCE | Attending: Internal Medicine | Admitting: Internal Medicine

## 2012-02-21 ENCOUNTER — Emergency Department (HOSPITAL_COMMUNITY): Payer: PRIVATE HEALTH INSURANCE

## 2012-02-21 DIAGNOSIS — N19 Unspecified kidney failure: Secondary | ICD-10-CM

## 2012-02-21 DIAGNOSIS — I129 Hypertensive chronic kidney disease with stage 1 through stage 4 chronic kidney disease, or unspecified chronic kidney disease: Secondary | ICD-10-CM | POA: Diagnosis present

## 2012-02-21 DIAGNOSIS — I10 Essential (primary) hypertension: Secondary | ICD-10-CM

## 2012-02-21 DIAGNOSIS — E860 Dehydration: Secondary | ICD-10-CM | POA: Diagnosis present

## 2012-02-21 DIAGNOSIS — B952 Enterococcus as the cause of diseases classified elsewhere: Secondary | ICD-10-CM | POA: Diagnosis present

## 2012-02-21 DIAGNOSIS — M79609 Pain in unspecified limb: Secondary | ICD-10-CM | POA: Diagnosis present

## 2012-02-21 DIAGNOSIS — N200 Calculus of kidney: Secondary | ICD-10-CM | POA: Diagnosis present

## 2012-02-21 DIAGNOSIS — M79606 Pain in leg, unspecified: Secondary | ICD-10-CM | POA: Diagnosis present

## 2012-02-21 DIAGNOSIS — E872 Acidosis, unspecified: Secondary | ICD-10-CM | POA: Diagnosis present

## 2012-02-21 DIAGNOSIS — N179 Acute kidney failure, unspecified: Principal | ICD-10-CM | POA: Diagnosis present

## 2012-02-21 DIAGNOSIS — R197 Diarrhea, unspecified: Secondary | ICD-10-CM | POA: Diagnosis present

## 2012-02-21 DIAGNOSIS — N12 Tubulo-interstitial nephritis, not specified as acute or chronic: Secondary | ICD-10-CM | POA: Diagnosis present

## 2012-02-21 DIAGNOSIS — D649 Anemia, unspecified: Secondary | ICD-10-CM | POA: Diagnosis present

## 2012-02-21 DIAGNOSIS — R7989 Other specified abnormal findings of blood chemistry: Secondary | ICD-10-CM | POA: Diagnosis present

## 2012-02-21 DIAGNOSIS — N10 Acute tubulo-interstitial nephritis: Secondary | ICD-10-CM | POA: Diagnosis present

## 2012-02-21 DIAGNOSIS — E875 Hyperkalemia: Secondary | ICD-10-CM | POA: Diagnosis present

## 2012-02-21 DIAGNOSIS — W57XXXA Bitten or stung by nonvenomous insect and other nonvenomous arthropods, initial encounter: Secondary | ICD-10-CM

## 2012-02-21 DIAGNOSIS — N189 Chronic kidney disease, unspecified: Secondary | ICD-10-CM | POA: Diagnosis present

## 2012-02-21 LAB — URINALYSIS, ROUTINE W REFLEX MICROSCOPIC
Bilirubin Urine: NEGATIVE
Glucose, UA: NEGATIVE mg/dL
Ketones, ur: NEGATIVE mg/dL
Nitrite: NEGATIVE
Protein, ur: 100 mg/dL — AB
Specific Gravity, Urine: 1.02 (ref 1.005–1.030)
Urobilinogen, UA: 0.2 mg/dL (ref 0.0–1.0)
pH: 5.5 (ref 5.0–8.0)

## 2012-02-21 LAB — BASIC METABOLIC PANEL
BUN: 142 mg/dL — ABNORMAL HIGH (ref 6–23)
BUN: 137 mg/dL — ABNORMAL HIGH (ref 6–23)
CO2: 19 mEq/L (ref 19–32)
CO2: 17 mEq/L — ABNORMAL LOW (ref 19–32)
Calcium: 10 mg/dL (ref 8.4–10.5)
Calcium: 9.8 mg/dL (ref 8.4–10.5)
Chloride: 99 mEq/L (ref 96–112)
Chloride: 102 mEq/L (ref 96–112)
Creatinine, Ser: 5.81 mg/dL — ABNORMAL HIGH (ref 0.50–1.35)
Creatinine, Ser: 5.58 mg/dL — ABNORMAL HIGH (ref 0.50–1.35)
GFR calc Af Amer: 11 mL/min — ABNORMAL LOW (ref >90)
GFR calc Af Amer: 12 mL/min — ABNORMAL LOW (ref >90)
GFR calc non Af Amer: 10 mL/min — ABNORMAL LOW (ref >90)
GFR calc non Af Amer: 10 mL/min — ABNORMAL LOW (ref >90)
Glucose, Bld: 110 mg/dL — ABNORMAL HIGH (ref 70–99)
Glucose, Bld: 132 mg/dL — ABNORMAL HIGH (ref 70–99)
Potassium: 5.8 mEq/L — ABNORMAL HIGH (ref 3.5–5.1)
Potassium: 4.9 mEq/L (ref 3.5–5.1)
Sodium: 134 mEq/L — ABNORMAL LOW (ref 135–145)
Sodium: 136 mEq/L (ref 135–145)

## 2012-02-21 LAB — CBC
HCT: 34.6 % — ABNORMAL LOW (ref 39.0–52.0)
Hemoglobin: 11.9 g/dL — ABNORMAL LOW (ref 13.0–17.0)
MCH: 29.8 pg (ref 26.0–34.0)
MCHC: 34.4 g/dL (ref 30.0–36.0)
MCV: 86.7 fL (ref 78.0–100.0)
Platelets: 169 10*3/uL (ref 150–400)
RBC: 3.99 MIL/uL — ABNORMAL LOW (ref 4.22–5.81)
RDW: 15.6 % — ABNORMAL HIGH (ref 11.5–15.5)
WBC: 8.9 10*3/uL (ref 4.0–10.5)

## 2012-02-21 LAB — DIFFERENTIAL
Basophils Absolute: 0 10*3/uL (ref 0.0–0.1)
Basophils Relative: 0 % (ref 0–1)
Eosinophils Absolute: 0.2 10*3/uL (ref 0.0–0.7)
Eosinophils Relative: 2 % (ref 0–5)
Lymphocytes Relative: 21 % (ref 12–46)
Lymphs Abs: 1.9 10*3/uL (ref 0.7–4.0)
Monocytes Absolute: 0.9 10*3/uL (ref 0.1–1.0)
Monocytes Relative: 10 % (ref 3–12)
Neutro Abs: 5.9 10*3/uL (ref 1.7–7.7)
Neutrophils Relative %: 67 % (ref 43–77)

## 2012-02-21 LAB — URINE MICROSCOPIC-ADD ON

## 2012-02-21 MED ORDER — TRAZODONE HCL 50 MG PO TABS
25.0000 mg | ORAL_TABLET | Freq: Every evening | ORAL | Status: DC | PRN
  Administered 2012-02-21: 25 mg via ORAL
  Filled 2012-02-21: qty 1

## 2012-02-21 MED ORDER — SODIUM CHLORIDE 0.9 % IV SOLN
1.0000 g | Freq: Once | INTRAVENOUS | Status: AC
  Administered 2012-02-21: 1 g via INTRAVENOUS
  Filled 2012-02-21: qty 10

## 2012-02-21 MED ORDER — ONDANSETRON HCL 4 MG PO TABS: 4.0000 mg | ORAL_TABLET | Freq: Four times a day (QID) | ORAL | Status: DC | PRN

## 2012-02-21 MED ORDER — SODIUM CHLORIDE 0.9 % IV BOLUS (SEPSIS)
1000.0000 mL | Freq: Once | INTRAVENOUS | Status: AC
  Administered 2012-02-21: 1000 mL via INTRAVENOUS

## 2012-02-21 MED ORDER — ACETAMINOPHEN 650 MG RE SUPP: 650.0000 mg | Freq: Four times a day (QID) | RECTAL | Status: DC | PRN

## 2012-02-21 MED ORDER — SODIUM CHLORIDE 0.9 % IV SOLN
INTRAVENOUS | Status: DC
  Administered 2012-02-21 – 2012-02-22 (×2): via INTRAVENOUS

## 2012-02-21 MED ORDER — ACETAMINOPHEN 325 MG PO TABS: 650.0000 mg | ORAL_TABLET | Freq: Four times a day (QID) | ORAL | Status: DC | PRN

## 2012-02-21 MED ORDER — METRONIDAZOLE 500 MG PO TABS
500.0000 mg | ORAL_TABLET | Freq: Three times a day (TID) | ORAL | Status: DC
  Administered 2012-02-21 – 2012-02-22 (×2): 500 mg via ORAL
  Filled 2012-02-21 (×2): qty 1

## 2012-02-21 MED ORDER — OXYCODONE HCL 5 MG PO TABS
5.0000 mg | ORAL_TABLET | ORAL | Status: DC | PRN
  Administered 2012-02-22 – 2012-02-26 (×4): 5 mg via ORAL
  Filled 2012-02-21 (×4): qty 1

## 2012-02-21 MED ORDER — HYDROMORPHONE HCL PF 1 MG/ML IJ SOLN
0.5000 mg | INTRAMUSCULAR | Status: DC | PRN
  Administered 2012-02-24 (×2): 0.5 mg via INTRAVENOUS
  Filled 2012-02-21 (×2): qty 1

## 2012-02-21 MED ORDER — SODIUM POLYSTYRENE SULFONATE 15 GM/60ML PO SUSP
30.0000 g | Freq: Once | ORAL | Status: AC
  Administered 2012-02-21: 30 g via ORAL
  Filled 2012-02-21: qty 120

## 2012-02-21 MED ORDER — CIPROFLOXACIN IN D5W 400 MG/200ML IV SOLN
400.0000 mg | Freq: Once | INTRAVENOUS | Status: AC
  Administered 2012-02-21: 400 mg via INTRAVENOUS
  Filled 2012-02-21: qty 200

## 2012-02-21 MED ORDER — ONDANSETRON HCL 4 MG/2ML IJ SOLN: 4.0000 mg | INTRAMUSCULAR | Status: DC | PRN

## 2012-02-21 MED ORDER — ENOXAPARIN SODIUM 30 MG/0.3ML ~~LOC~~ SOLN
30.0000 mg | SUBCUTANEOUS | Status: DC
  Administered 2012-02-21 – 2012-02-23 (×3): 30 mg via SUBCUTANEOUS
  Filled 2012-02-21 (×4): qty 0.3

## 2012-02-21 MED ORDER — DILTIAZEM HCL 60 MG PO TABS
120.0000 mg | ORAL_TABLET | Freq: Every day | ORAL | Status: DC
  Administered 2012-02-22 – 2012-02-26 (×5): 120 mg via ORAL
  Filled 2012-02-21 (×5): qty 2

## 2012-02-21 NOTE — H&P (Signed)
PCP:  [facility]   Urologist: Dr. Redge Gainer  Chief Complaint:  Progressive weakness and anorexia past few days  HPI: [name] is an 61 y.o. male.   Middle-aged [place] American gentleman discharged from the hospitalist service on April 19, after bilateral ureteral stent placement for acute renal failure caused by kidney stones. Creatinine had decreased to 3.3 at the time of discharge, and he was sent home with a narcotics and stool softeners to follow up with Dr. Jerre Simon in one week.  Patient began to have loose watery stools, and cramping abdominal pain from the day after discharge, but continued to take Colace. Patient did not keep his followup urology appointment on April 26, because by then diarrhea was so bad he was incontinent of stool. Eventually visited Dr. Jerre Simon today and blood work showed a markedly elevated BUN and creatinine. Patient did not mention his diarrhea to Dr. Jerre Simon, and he was sent in to the emergency room to be admitted for nephrology consult and possible dialysis.   Patient denies resumption in use of any NSAIDS, diuretics, ACE inhibitors.  Patient has had 3 loose stools since being in the emergency room tonight, but his family are more concerned about the pain that accompanies his bowel movements.  Patient require received Kayexalate in the emergency room for hyperkalemia  He denies any fever frequency or dysuria. He has become increasingly lethargic and anorexic.  CT scan in the emergency room shows improved hydronephrosis, but no evidence of worsening obstruction.  Rewiew of Systems:  The patient denies fever, weight loss,, vision loss, decreased hearing, hoarseness, chest pain, syncope, dyspnea on exertion, peripheral edema, balance deficits, hemoptysis, abdominal pain, melena, hematochezia, severe indigestion/heartburn, hematuria, incontinence, genital sores, muscle weakness, suspicious skin lesions, transient blindness,  difficulty walking, depression, unusual weight change, abnormal bleeding, enlarged lymph nodes, angioedema, and breast masses.    Past Medical History  Diagnosis Date  . Hypertension   . B12 deficiency   . Hernia     umbilical  . Renal disorder     kidney stones  . Obstructive uropathy 02/08/2012  . Gross hematuria 02/09/2012    Past Surgical History  Procedure Date  . Cystoscopy w/ ureteral stent placement 02/08/2012    Procedure: CYSTOSCOPY WITH RETROGRADE PYELOGRAM/URETERAL STENT PLACEMENT;  Surgeon: Ky Barban, MD;  Location: [facility];  Service: Urology;  Laterality: Bilateral;  Bilateral Double J Stent     Medications:  HOME MEDS: Prior to Admission medications   Medication Sig Start Date End Date Taking? Authorizing Provider  cyanocobalamin (,VITAMIN B-12,) 1000 MCG/ML injection Inject 1,000 mcg into the muscle every 30 (thirty) days.   Yes Historical Provider, MD  diltiazem (CARDIZEM) 120 MG tablet Take 1 tablet (120 mg total) by mouth daily. FOR HIGH BLOOD PRESSURE. 02/10/12 02/09/13 Yes Elliot Cousin, MD  docusate sodium 100 MG CAPS Take 100 mg by mouth 2 (two) times daily. STOOL SOFTENER. 02/10/12 03/22/12 Yes Elliot Cousin, MD  HYDROcodone-acetaminophen (NORCO) 5-325 MG per tablet Take 1-2 tablets by mouth every 4 (four) hours as needed. 02/10/12 02/20/12  Elliot Cousin, MD     Allergies:  No Known Allergies  Social History:   reports that he has never smoked. He does not have any smokeless tobacco history on file. He reports that he does not drink alcohol or use illicit drugs.  Family History: Family History  Problem Relation Age of Onset  . Cancer Mother   . Heart attack Father 35  . Cirrhosis Brother  40    alcoholic     Physical Exam: Filed Vitals:   02/21/12 1523 02/21/12 1932 02/21/12 2000 02/21/12 2231  BP: 98/63 109/70 112/77 148/83  Pulse: 88 94 93 97  Temp: 98.8 F (37.1 C)   97.7 F (36.5 C)  TempSrc: Oral   Oral  Resp: 20  20 20   Height:     5\' 10"  (1.778 m)  Weight:    90.5 kg (199 lb 8.3 oz)  SpO2: 99% 97% 97% 100%   Blood pressure 148/83, pulse 97, temperature 97.7 F (36.5 C), temperature source Oral, resp. rate 20, height 5\' 10"  (1.778 m), weight 90.5 kg (199 lb 8.3 oz), SpO2 100.00%.  GEN:  Pleasant middle-aged African American gentleman lying in the stretcher in no acute distress; cooperative with exam; wife and daughter present in the exam room PSYCH:  alert and oriented x4; appears somewhat anxious but affect is flat HEENT: Mucous membranes pink, dry, and anicteric; PERRLA; EOM intact; no cervical lymphadenopathy nor thyromegaly or carotid bruit; no JVD; Breasts:: Not examined CHEST WALL: No tenderness CHEST: Normal respiration, clear to auscultation bilaterally HEART: Regular rate and rhythm; no murmurs rubs or gallops BACK: No kyphosis or scoliosis; no CVA tenderness ABDOMEN: Obese, soft non-tender; no masses, no organomegaly, normal abdominal bowel sounds;  no intertriginous candida. Rectal Exam: Not done EXTREMITIES:  age-appropriate arthropathy of the hands and knees; no edema; no ulcerations. Genitalia: not examined PULSES: 2+ and symmetric SKIN: Normal hydration no rash or ulceration CNS: Cranial nerves 2-12 grossly intact no focal lateralizing neurologic deficit   Labs & Imaging Results for orders placed during the hospital encounter of 02/21/12 (from the past 48 hour(s))  URINALYSIS, ROUTINE W REFLEX MICROSCOPIC     Status: Abnormal   Collection Time   02/21/12  4:03 PM      Component Value Range Comment   Color, Urine AMBER (*) YELLOW  BIOCHEMICALS MAY BE AFFECTED BY COLOR   APPearance CLOUDY (*) CLEAR     Specific Gravity, Urine 1.020  1.005 - 1.030     pH 5.5  5.0 - 8.0     Glucose, UA NEGATIVE  NEGATIVE (mg/dL)    Hgb urine dipstick LARGE (*) NEGATIVE     Bilirubin Urine NEGATIVE  NEGATIVE     Ketones, ur NEGATIVE  NEGATIVE (mg/dL)    Protein, ur 098 (*) NEGATIVE (mg/dL)    Urobilinogen, UA 0.2   0.0 - 1.0 (mg/dL)    Nitrite NEGATIVE  NEGATIVE     Leukocytes, UA MODERATE (*) NEGATIVE    URINE MICROSCOPIC-ADD ON     Status: Abnormal   Collection Time   02/21/12  4:03 PM      Component Value Range Comment   Squamous Epithelial / LPF FEW (*) RARE     WBC, UA TOO NUMEROUS TO COUNT  <3 (WBC/hpf)    RBC / HPF TOO NUMEROUS TO COUNT  <3 (RBC/hpf)    Bacteria, UA MANY (*) RARE    CBC     Status: Abnormal   Collection Time   02/21/12  4:15 PM      Component Value Range Comment   WBC 8.9  4.0 - 10.5 (K/uL)    RBC 3.99 (*) 4.22 - 5.81 (MIL/uL)    Hemoglobin 11.9 (*) 13.0 - 17.0 (g/dL)    HCT 11.9 (*) 14.7 - 52.0 (%)    MCV 86.7  78.0 - 100.0 (fL)    MCH 29.8  26.0 - 34.0 (pg)  MCHC 34.4  30.0 - 36.0 (g/dL)    RDW 78.2 (*) 95.6 - 15.5 (%)    Platelets 169  150 - 400 (K/uL)   DIFFERENTIAL     Status: Normal   Collection Time   02/21/12  4:15 PM      Component Value Range Comment   Neutrophils Relative 67  43 - 77 (%)    Lymphocytes Relative 21  12 - 46 (%)    Monocytes Relative 10  3 - 12 (%)    Eosinophils Relative 2  0 - 5 (%)    Basophils Relative 0  0 - 1 (%)    Neutro Abs 5.9  1.7 - 7.7 (K/uL)    Lymphs Abs 1.9  0.7 - 4.0 (K/uL)    Monocytes Absolute 0.9  0.1 - 1.0 (K/uL)    Eosinophils Absolute 0.2  0.0 - 0.7 (K/uL)    Basophils Absolute 0.0  0.0 - 0.1 (K/uL)    WBC Morphology ATYPICAL LYMPHOCYTES      Smear Review LARGE PLATELETS PRESENT     BASIC METABOLIC PANEL     Status: Abnormal   Collection Time   02/21/12  4:15 PM      Component Value Range Comment   Sodium 134 (*) 135 - 145 (mEq/L)    Potassium 5.8 (*) 3.5 - 5.1 (mEq/L)    Chloride 99  96 - 112 (mEq/L)    CO2 19  19 - 32 (mEq/L)    Glucose, Bld 110 (*) 70 - 99 (mg/dL)    BUN 213 (*) 6 - 23 (mg/dL)    Creatinine, Ser 0.86 (*) 0.50 - 1.35 (mg/dL)    Calcium 57.8  8.4 - 10.5 (mg/dL)    GFR calc non Af Amer 10 (*) >90 (mL/min)    GFR calc Af Amer 11 (*) >90 (mL/min)   BASIC METABOLIC PANEL     Status: Abnormal    Collection Time   02/21/12  7:50 PM      Component Value Range Comment   Sodium 136  135 - 145 (mEq/L)    Potassium 4.9  3.5 - 5.1 (mEq/L)    Chloride 102  96 - 112 (mEq/L)    CO2 17 (*) 19 - 32 (mEq/L)    Glucose, Bld 132 (*) 70 - 99 (mg/dL)    BUN 469 (*) 6 - 23 (mg/dL)    Creatinine, Ser 6.29 (*) 0.50 - 1.35 (mg/dL)    Calcium 9.8  8.4 - 10.5 (mg/dL)    GFR calc non Af Amer 10 (*) >90 (mL/min)    GFR calc Af Amer 12 (*) >90 (mL/min)    Ct Abdomen Pelvis Wo Contrast  02/21/2012  *RADIOLOGY REPORT*  Clinical Data: 61 year old male with difficulty urinating.  History of renal stents.  Increasing creatinine.  CT ABDOMEN AND PELVIS WITHOUT CONTRAST  Technique:  Multidetector CT imaging of the abdomen and pelvis was performed following the standard protocol without intravenous contrast.  Comparison: 02/08/2012 and earlier.  Findings: Lung bases are clear. Stable visualized osseous structures.  Degenerative changes in the spine.  Degenerative changes of the hips.  Foley catheter now present within the bladder which is decompressed.  Bilateral double J ureteral stent now in place. Mild bilateral peri ureteral stranding.  Left ureter had is decompressed from prior.  The right UPJ and proximal ureter calculi have not significantly changed in position (series 2 images 28 and 33).  Small volume of gas within the right renal collecting system. No  other right side calculus.  On the left the large lower pole intrarenal stone is stable.  No other left side calculus.  Small volume of left collecting system gas.  Superimposed chronic right renal atrophy and left nephromegaly. Mild bilateral perinephric stranding is mildly increased.  No pelvic free fluid.  Distal colon contains fluid and there are occasional diverticula again noted.  Negative proximal colon and appendix.  No dilated small bowel loops.  Fat containing umbilical hernia.  Trace oral contrast in the stomach and duodenum.  Negative noncontrast liver,  gallbladder, spleen, and pancreas.  Stable bilateral adrenal thickening.  No abdominal free fluid.  IMPRESSION: 1.  Bilateral double-J ureteral stents now in place.  Interval decompression of the left ureter. 2.  Stable position of the right UPJ and proximal ureter stones. Stable large left intrarenal stone. 3.  Mild increased perinephric and periureteral stranding is nonspecific but might reflect stent placement. Small volume bilateral renal collecting system gas is felt to be related to stent and Foley catheter placement. 4.  No other acute findings in the abdomen or pelvis.  Original Report Authenticated By: Harley Hallmark, M.D.      Assessment Present on Admission:  .AKI (acute kidney injury), acute worsening of already acute injury due to dehydration  .Diarrhea, due to Colace, possibly aggravating an infectious diarrhea .Dehydration, severe due to persistent diarrhea  .Hyperkalemia, due to dehydration and acute renal failure Urinary tract infection  .Renal calculi, status post stenting  .HTN (hypertension) .Anemia   PLAN: We will admit this gentleman for vigorous hydration; we have difficulty getting bicarbonate fluids at night, but will consider switching to a bicarbonate solution in the morning.  We'll check stool for C. difficile and empirically start Flagyl. Will discontinue Colace, and advised not to take laxatives and stool softeners while having diarrhea.  Patient has received a dose of Cipro, will await cultures and likely start Rocephin or other within the next 24 hours.  Consider nephrology consult if renal function is not improving with hydration   Other plans as per orders.  Cylus Douville 02/21/2012, 11:14 PM

## 2012-02-21 NOTE — ED Notes (Signed)
Painful urination, states he was sent from Dr Lawerance Cruel office for evaluation

## 2012-02-21 NOTE — ED Notes (Signed)
Ct aware pt is ready.  

## 2012-02-21 NOTE — ED Notes (Signed)
Pt had medium mooshy bm prior to going to ct

## 2012-02-21 NOTE — ED Notes (Signed)
Attempted to call report on patient for second time, secretary stated that donna was off the floor and that she would give her a message to call me back. Family upset that patient is not upstairs yet, asking how long it will be.

## 2012-02-21 NOTE — ED Notes (Signed)
Dr. Orvan Falconer stated to bolus liter bag of fluid that was hanging at kvo rate.

## 2012-02-21 NOTE — ED Notes (Signed)
States his kidneys are not working

## 2012-02-21 NOTE — ED Provider Notes (Signed)
History    61-year-old male referred from Dr. Rudean Haskell office for evaluation of renal failure. Patient recently admitted with obstructive uropathy secondary to ureterolithiasis. He is status post bilateral ureteral stenting. He was discharged with improving renal function. DC Cr 3.3. Patient states that he was in Dr. Rudean Haskell office today for a followup appointment and told that he had to come to the emergency room because his renal function was worsening. He with a Foley catheter in place. He says that he is making urine but he is unable to quantify it very well. He had hematuria after the stent placement which has cleared. No fevers or chills. Patient was previously on an ACE inhibitor which was stopped with his last admission. He reports very poor by mouth intake recently.   CSN: [id number]  Arrival date & time 02/21/12  1510   First MD Initiated Contact with Patient 02/21/12 1528      Chief Complaint  Patient presents with  . Acute Renal Failure    (Consider location/radiation/quality/duration/timing/severity/associated sxs/prior treatment) HPI  Past Medical History  Diagnosis Date  . Hypertension   . B12 deficiency   . Hernia     umbilical  . Renal disorder     kidney stones  . Obstructive uropathy 02/08/2012  . Gross hematuria 02/09/2012    Past Surgical History  Procedure Date  . Cystoscopy w/ ureteral stent placement 02/08/2012    Procedure: CYSTOSCOPY WITH RETROGRADE PYELOGRAM/URETERAL STENT PLACEMENT;  Surgeon: Ky Barban, MD;  Location: [facility];  Service: Urology;  Laterality: Bilateral;  Bilateral Double J Stent     No family history on file.  History  Substance Use Topics  . Smoking status: Never Smoker   . Smokeless tobacco: Not on file  . Alcohol Use: No      Review of Systems   Review of symptoms negative unless otherwise noted in HPI.   Allergies  Review of patient's allergies indicates no known allergies.  Home Medications   Current  Outpatient Rx  Name Route Sig Dispense Refill  . CYANOCOBALAMIN 1000 MCG/ML IJ SOLN Intramuscular Inject 1,000 mcg into the muscle every 30 (thirty) days.    Marland Kitchen DILTIAZEM HCL 120 MG PO TABS Oral Take 1 tablet (120 mg total) by mouth daily. FOR HIGH BLOOD PRESSURE. 30 tablet 1  . DSS 100 MG PO CAPS Oral Take 100 mg by mouth 2 (two) times daily. STOOL SOFTENER. 10 capsule   . HYDROCODONE-ACETAMINOPHEN 5-325 MG PO TABS Oral Take 1-2 tablets by mouth every 4 (four) hours as needed. 30 tablet 0    BP 98/63  Pulse 88  Temp(Src) 98.8 F (37.1 C) (Oral)  Resp 20  Ht 5\' 11"  (1.803 m)  Wt 201 lb (91.173 kg)  BMI 28.03 kg/m2  SpO2 99%  Physical Exam  Nursing note and vitals reviewed. Constitutional: He is oriented to person, place, and time. He appears well-developed and well-nourished. No distress.  HENT:  Head: Normocephalic and atraumatic.  Eyes: Conjunctivae are normal. Right eye exhibits no discharge. Left eye exhibits no discharge.  Neck: Neck supple.  Cardiovascular: Normal rate, regular rhythm and normal heart sounds.  Exam reveals no gallop and no friction rub.   No murmur heard. Pulmonary/Chest: Effort normal and breath sounds normal. No respiratory distress.  Abdominal: Soft. He exhibits no distension. There is no tenderness.  Genitourinary:       Foley to leg bag. Urine is dark. No blood or sediment noted.  Musculoskeletal: He exhibits no edema and no  tenderness.  Neurological: He is alert and oriented to person, place, and time. No cranial nerve deficit. He exhibits normal muscle tone. Coordination normal.  Skin: Skin is warm and dry.  Psychiatric: He has a normal mood and affect. His behavior is normal. Thought content normal.    ED Course  Procedures (including critical care time)  Labs Reviewed  CBC - Abnormal; Notable for the following:    RBC 3.99 (*)    Hemoglobin 11.9 (*)    HCT 34.6 (*)    RDW 15.6 (*)    All other components within normal limits  BASIC  METABOLIC PANEL - Abnormal; Notable for the following:    Sodium 134 (*)    Potassium 5.8 (*)    Glucose, Bld 110 (*)    BUN 142 (*)    Creatinine, Ser 5.81 (*)    GFR calc non Af Amer 10 (*)    GFR calc Af Amer 11 (*)    All other components within normal limits  URINALYSIS, ROUTINE W REFLEX MICROSCOPIC - Abnormal; Notable for the following:    Color, Urine AMBER (*) BIOCHEMICALS MAY BE AFFECTED BY COLOR   APPearance CLOUDY (*)    Hgb urine dipstick LARGE (*)    Protein, ur 100 (*)    Leukocytes, UA MODERATE (*)    All other components within normal limits  URINE MICROSCOPIC-ADD ON - Abnormal; Notable for the following:    Squamous Epithelial / LPF FEW (*)    Bacteria, UA MANY (*)    All other components within normal limits  DIFFERENTIAL  URINE CULTURE   Ct Abdomen Pelvis Wo Contrast  02/21/2012  *RADIOLOGY REPORT*  Clinical Data: 61 year old male with difficulty urinating.  History of renal stents.  Increasing creatinine.  CT ABDOMEN AND PELVIS WITHOUT CONTRAST  Technique:  Multidetector CT imaging of the abdomen and pelvis was performed following the standard protocol without intravenous contrast.  Comparison: 02/08/2012 and earlier.  Findings: Lung bases are clear. Stable visualized osseous structures.  Degenerative changes in the spine.  Degenerative changes of the hips.  Foley catheter now present within the bladder which is decompressed.  Bilateral double J ureteral stent now in place. Mild bilateral peri ureteral stranding.  Left ureter had is decompressed from prior.  The right UPJ and proximal ureter calculi have not significantly changed in position (series 2 images 28 and 33).  Small volume of gas within the right renal collecting system. No other right side calculus.  On the left the large lower pole intrarenal stone is stable.  No other left side calculus.  Small volume of left collecting system gas.  Superimposed chronic right renal atrophy and left nephromegaly. Mild  bilateral perinephric stranding is mildly increased.  No pelvic free fluid.  Distal colon contains fluid and there are occasional diverticula again noted.  Negative proximal colon and appendix.  No dilated small bowel loops.  Fat containing umbilical hernia.  Trace oral contrast in the stomach and duodenum.  Negative noncontrast liver, gallbladder, spleen, and pancreas.  Stable bilateral adrenal thickening.  No abdominal free fluid.  IMPRESSION: 1.  Bilateral double-J ureteral stents now in place.  Interval decompression of the left ureter. 2.  Stable position of the right UPJ and proximal ureter stones. Stable large left intrarenal stone. 3.  Mild increased perinephric and periureteral stranding is nonspecific but might reflect stent placement. Small volume bilateral renal collecting system gas is felt to be related to stent and Foley catheter placement. 4.  No  other acute findings in the abdomen or pelvis.  Original Report Authenticated By: Harley Hallmark, M.D.     1. Renal failure   2. Hyperkalemia       MDM  61 year old male with worsening renal function. Significant uremia but no mental status changes. This is likely multifactorial. Poor PO intake recently. Given recent hx will CT to eval for obstruction. Hyperkalemia likely secondary to renal function. No concerning EKG changes. Calcium gluconate was given. Kayexalate. Likely contaminated urine specimen given indwelling device but will tx at this time. Will require admit.        Raeford Razor, MD 02/21/12 2020

## 2012-02-21 NOTE — ED Notes (Signed)
Attempted to call report on patient to floor, nurse stated donna was busy at the moment and would tell her to call me back.

## 2012-02-22 ENCOUNTER — Encounter (HOSPITAL_COMMUNITY): Payer: Self-pay

## 2012-02-22 DIAGNOSIS — E872 Acidosis, unspecified: Secondary | ICD-10-CM

## 2012-02-22 DIAGNOSIS — E875 Hyperkalemia: Secondary | ICD-10-CM

## 2012-02-22 DIAGNOSIS — N179 Acute kidney failure, unspecified: Secondary | ICD-10-CM

## 2012-02-22 DIAGNOSIS — N10 Acute tubulo-interstitial nephritis: Secondary | ICD-10-CM | POA: Diagnosis present

## 2012-02-22 LAB — COMPREHENSIVE METABOLIC PANEL
Alkaline Phosphatase: 64 U/L (ref 39–117)
BUN: 125 mg/dL — ABNORMAL HIGH (ref 6–23)
Chloride: 111 mEq/L (ref 96–112)
Creatinine, Ser: 5.07 mg/dL — ABNORMAL HIGH (ref 0.50–1.35)
GFR calc Af Amer: 13 mL/min — ABNORMAL LOW (ref >90)
Glucose, Bld: 102 mg/dL — ABNORMAL HIGH (ref 70–99)
Potassium: 5.6 mEq/L — ABNORMAL HIGH (ref 3.5–5.1)
Total Bilirubin: 0.2 mg/dL — ABNORMAL LOW (ref 0.3–1.2)
Total Protein: 6.7 g/dL (ref 6.0–8.3)

## 2012-02-22 LAB — CBC
HCT: 32.9 % — ABNORMAL LOW (ref 39.0–52.0)
Hemoglobin: 10.9 g/dL — ABNORMAL LOW (ref 13.0–17.0)
MCHC: 33.1 g/dL (ref 30.0–36.0)
MCV: 89.2 fL (ref 78.0–100.0)
RDW: 16.1 % — ABNORMAL HIGH (ref 11.5–15.5)

## 2012-02-22 LAB — BASIC METABOLIC PANEL
Calcium: 9.1 mg/dL (ref 8.4–10.5)
GFR calc non Af Amer: 11 mL/min — ABNORMAL LOW (ref >90)
Potassium: 5.1 mEq/L (ref 3.5–5.1)
Sodium: 137 mEq/L (ref 135–145)

## 2012-02-22 LAB — MAGNESIUM: Magnesium: 3.1 mg/dL — ABNORMAL HIGH (ref 1.5–2.5)

## 2012-02-22 LAB — CLOSTRIDIUM DIFFICILE BY PCR: Toxigenic C. Difficile by PCR: NEGATIVE

## 2012-02-22 MED ORDER — DOXYCYCLINE HYCLATE 100 MG PO TABS
100.0000 mg | ORAL_TABLET | Freq: Two times a day (BID) | ORAL | Status: DC
  Administered 2012-02-22 – 2012-02-23 (×3): 100 mg via ORAL
  Filled 2012-02-22 (×3): qty 1

## 2012-02-22 MED ORDER — CIPROFLOXACIN IN D5W 400 MG/200ML IV SOLN
400.0000 mg | INTRAVENOUS | Status: DC
  Administered 2012-02-22: 400 mg via INTRAVENOUS
  Filled 2012-02-22 (×2): qty 200

## 2012-02-22 MED ORDER — FUROSEMIDE 10 MG/ML IJ SOLN
10.0000 mg | Freq: Two times a day (BID) | INTRAMUSCULAR | Status: AC
  Administered 2012-02-22 (×2): 10 mg via INTRAVENOUS
  Filled 2012-02-22 (×2): qty 2

## 2012-02-22 MED ORDER — SODIUM CHLORIDE 0.45 % IV SOLN
INTRAVENOUS | Status: DC
  Administered 2012-02-22 – 2012-02-23 (×4): via INTRAVENOUS
  Filled 2012-02-22 (×7): qty 1000

## 2012-02-22 MED ORDER — SODIUM CHLORIDE 0.9 % IJ SOLN
INTRAMUSCULAR | Status: AC
  Administered 2012-02-22: 10 mL
  Filled 2012-02-22: qty 3

## 2012-02-22 NOTE — Progress Notes (Signed)
Printed info given to patient on kidney failure.  This was discussed with him at length.  He verbalized understanding.

## 2012-02-22 NOTE — Progress Notes (Signed)
Subjective: The patient has less diarrhea and less nausea. He tolerated his diet this morning. He has no flank pain or lower abdominal pain. He still feels weak all over.  Objective: Vital signs in last 24 hours: Filed Vitals:   02/21/12 1932 02/21/12 2000 02/21/12 2231 02/22/12 0236  BP: 109/70 112/77 148/83 122/74  Pulse: 94 93 97 74  Temp:   97.7 F (36.5 C) 98.2 F (36.8 C)  TempSrc:   Oral Oral  Resp:  20 20 20   Height:   5\' 10"  (1.778 m)   Weight:   90.5 kg (199 lb 8.3 oz)   SpO2: 97% 97% 100% 98%    Intake/Output Summary (Last 24 hours) at 02/22/12 1048 Last data filed at 02/22/12 0458  Gross per 24 hour  Intake      0 ml  Output   1515 ml  Net  -1515 ml    Weight change:   Lungs: Decreased breath sounds in the bases and clear anteriorly. Heart: S1, S2, with no murmurs rubs or gallops. Abdomen: Obese, positive bowel sounds, soft, nontender. Extremities: No pedal edema. Neurologic: He is alert and oriented x3. Cranial nerves II through XII are intact. His speech is clear.   Lab Results: Basic Metabolic Panel:  Basename 02/22/12 0518 02/21/12 2356  NA 142 137  K 5.6* 5.1  CL 111 106  CO2 18* 16*  GLUCOSE 102* 108*  BUN 125* 131*  CREATININE 5.07* 5.07*  CALCIUM 9.1 9.1  MG 3.1* --  PHOS -- --   Liver Function Tests:  Basename 02/22/12 0518  AST 39*  ALT 60*  ALKPHOS 64  BILITOT 0.2*  PROT 6.7  ALBUMIN 2.3*   No results found for this basename: LIPASE:2,AMYLASE:2 in the last 72 hours No results found for this basename: AMMONIA:2 in the last 72 hours CBC:  Basename 02/22/12 0518 02/21/12 1615  WBC 7.6 8.9  NEUTROABS -- 5.9  HGB 10.9* 11.9*  HCT 32.9* 34.6*  MCV 89.2 86.7  PLT 197 169   Cardiac Enzymes: No results found for this basename: CKTOTAL:3,CKMB:3,CKMBINDEX:3,TROPONINI:3 in the last 72 hours BNP: No results found for this basename: PROBNP:3 in the last 72 hours D-Dimer: No results found for this basename: DDIMER:2 in the last 72  hours CBG: No results found for this basename: GLUCAP:6 in the last 72 hours Hemoglobin A1C: No results found for this basename: HGBA1C in the last 72 hours Fasting Lipid Panel: No results found for this basename: CHOL,HDL,LDLCALC,TRIG,CHOLHDL,LDLDIRECT in the last 72 hours Thyroid Function Tests: No results found for this basename: TSH,T4TOTAL,FREET4,T3FREE,THYROIDAB in the last 72 hours Anemia Panel: No results found for this basename: VITAMINB12,FOLATE,FERRITIN,TIBC,IRON,RETICCTPCT in the last 72 hours Coagulation: No results found for this basename: LABPROT:2,INR:2 in the last 72 hours Urine Drug Screen: Drugs of Abuse  No results found for this basename: labopia, cocainscrnur, labbenz, amphetmu, thcu, labbarb    Alcohol Level: No results found for this basename: ETH:2 in the last 72 hours Urinalysis:  Basename 02/21/12 1603  COLORURINE AMBER*  LABSPEC 1.020  PHURINE 5.5  GLUCOSEU NEGATIVE  HGBUR LARGE*  BILIRUBINUR NEGATIVE  KETONESUR NEGATIVE  PROTEINUR 100*  UROBILINOGEN 0.2  NITRITE NEGATIVE  LEUKOCYTESUR MODERATE*   Misc. Labs:   Micro: No results found for this or any previous visit (from the past 240 hour(s)).  Studies/Results: Ct Abdomen Pelvis Wo Contrast  02/21/2012  *RADIOLOGY REPORT*  Clinical Data: 61 year old male with difficulty urinating.  History of renal stents.  Increasing creatinine.  CT ABDOMEN AND  PELVIS WITHOUT CONTRAST  Technique:  Multidetector CT imaging of the abdomen and pelvis was performed following the standard protocol without intravenous contrast.  Comparison: 02/08/2012 and earlier.  Findings: Lung bases are clear. Stable visualized osseous structures.  Degenerative changes in the spine.  Degenerative changes of the hips.  Foley catheter now present within the bladder which is decompressed.  Bilateral double J ureteral stent now in place. Mild bilateral peri ureteral stranding.  Left ureter had is decompressed from prior.  The right UPJ  and proximal ureter calculi have not significantly changed in position (series 2 images 28 and 33).  Small volume of gas within the right renal collecting system. No other right side calculus.  On the left the large lower pole intrarenal stone is stable.  No other left side calculus.  Small volume of left collecting system gas.  Superimposed chronic right renal atrophy and left nephromegaly. Mild bilateral perinephric stranding is mildly increased.  No pelvic free fluid.  Distal colon contains fluid and there are occasional diverticula again noted.  Negative proximal colon and appendix.  No dilated small bowel loops.  Fat containing umbilical hernia.  Trace oral contrast in the stomach and duodenum.  Negative noncontrast liver, gallbladder, spleen, and pancreas.  Stable bilateral adrenal thickening.  No abdominal free fluid.  IMPRESSION: 1.  Bilateral double-J ureteral stents now in place.  Interval decompression of the left ureter. 2.  Stable position of the right UPJ and proximal ureter stones. Stable large left intrarenal stone. 3.  Mild increased perinephric and periureteral stranding is nonspecific but might reflect stent placement. Small volume bilateral renal collecting system gas is felt to be related to stent and Foley catheter placement. 4.  No other acute findings in the abdomen or pelvis.  Original Report Authenticated By: Harley Hallmark, M.D.    Medications:  Scheduled:   . calcium gluconate  1 g Intravenous Once  . ciprofloxacin  400 mg Intravenous Once  . ciprofloxacin  400 mg Intravenous Q24H  . diltiazem  120 mg Oral Daily  . enoxaparin  30 mg Subcutaneous Q24H  . furosemide  10 mg Intravenous Q12H  . metroNIDAZOLE  500 mg Oral Q8H  . sodium chloride  1,000 mL Intravenous Once  . sodium chloride  1,000 mL Intravenous Once  . sodium polystyrene  30 g Oral Once   Continuous:   . sodium chloride 0.45 % 1,000 mL with sodium bicarbonate 50 mEq infusion    . DISCONTD: sodium chloride  150 mL/hr at 02/22/12 4540   JWJ:[id number], acetaminophen, HYDROmorphone, ondansetron (ZOFRAN) IV, ondansetron, oxyCODONE, traZODone  Assessment: Active Problems:  AKI (acute kidney injury)  Hyperkalemia  Renal calculi  Anemia  HTN (hypertension)  Diarrhea  Dehydration  Acute pyelonephritis  Metabolic acidosis   1. Acute pyelonephritis, status post insertion of bilateral double-J ureteral stents 2 weeks ago. He is on IV Cipro. Urine culture is pending.  Reported diarrhea, resolving. C difficile PCR is pending. Flagyl was started empirically.  Acute renal injury/failure. At the time of discharge during the previous hospitalization, his creatinine was 3.3. On admission it was 5.8. Today it is 5.07. This is in part due to prerenal azotemia. There appears to be no hydronephrosis on the CT scan, however constructive uropathy is still a concern.  Metabolic acidosis, secondary to acute renal failure.  Hyperkalemia, secondary to acute renal failure. He was given calcium carbonate and Kayexalate yesterday.  Anemia, secondary to previous hematuria from renal stones.  Recent history of obstructive uropathy  with kidney stones, status post bilateral double-J stent insertion 2 weeks ago by Dr. Jerre Simon.  Plan:  1. Continue IV fluid hydration, however, we'll change IV fluids to half-normal saline with bicarbonate added to treat his hyperkalemia and metabolic acidosis. 2. Will add gentle Lasix for 24 hours to treat the hyperkalemia as well. 3. Will check urine culture pending. We'll check C. difficile PCR pending. 4. The patient's renal function does not improve on supportive treatment, consider nephrology consult.     LOS: 1 day   Oliva Montecalvo 02/22/2012, 10:48 AM

## 2012-02-22 NOTE — Progress Notes (Signed)
02/22/12 1456 Small tick found to patient's left lower back this afternoon, tick removed and Dr Sherrie Mustache notified. Doxycycline ordered and nursing to monitor.  Also notified dr Sherrie Mustache of patient's c-diff PCR negative. Had soft, formed stool this morning.

## 2012-02-22 NOTE — Consult Note (Signed)
ANTIBIOTIC CONSULT NOTE - INITIAL  Pharmacy Consult for Cipro Indication: UTI  No Known Allergies  Patient Measurements: Height: 5\' 10"  (177.8 cm) Weight: 199 lb 8.3 oz (90.5 kg) IBW/kg (Calculated) : 73   Vital Signs: Temp: 98.2 F (36.8 C) (05/01 0236) Temp src: Oral (05/01 0236) BP: 122/74 mmHg (05/01 0236) Pulse Rate: 74  (05/01 0236) Intake/Output from previous day: 04/30 0701 - 05/01 0700 In: -  Out: 1515 [Urine:1515] Intake/Output from this shift:    Labs:  Basename 02/22/12 0518 02/21/12 2356 02/21/12 1950 02/21/12 1615  WBC 7.6 -- -- 8.9  HGB 10.9* -- -- 11.9*  PLT 197 -- -- 169  LABCREA -- -- -- --  CREATININE 5.07* 5.07* 5.58* --   Estimated Creatinine Clearance: 17.5 ml/min (by C-G formula based on Cr of 5.07). No results found for this basename: VANCOTROUGH:2,VANCOPEAK:2,VANCORANDOM:2,GENTTROUGH:2,GENTPEAK:2,GENTRANDOM:2,TOBRATROUGH:2,TOBRAPEAK:2,TOBRARND:2,AMIKACINPEAK:2,AMIKACINTROU:2,AMIKACIN:2, in the last 72 hours   Microbiology: Recent Results (from the past 720 hour(s))  STONE ANALYSIS     Status: Normal   Collection Time   02/07/12  7:46 AM      Component Value Range Status Comment   Nidus Not observed   Final    Component 1 KSTONE See Below   Final    Stone Weight KSTONE 0.4550   Final   SURGICAL PCR SCREEN     Status: Normal   Collection Time   02/07/12  7:37 PM      Component Value Range Status Comment   MRSA, PCR NEGATIVE  NEGATIVE  Final    Staphylococcus aureus NEGATIVE  NEGATIVE  Final    Medical History: Past Medical History  Diagnosis Date  . Hypertension   . B12 deficiency   . Hernia     umbilical  . Renal disorder     kidney stones  . Obstructive uropathy 02/08/2012  . Gross hematuria 02/09/2012   Medications:  Scheduled:    . calcium gluconate  1 g Intravenous Once  . ciprofloxacin  400 mg Intravenous Once  . ciprofloxacin  400 mg Intravenous Q24H  . diltiazem  120 mg Oral Daily  . enoxaparin  30 mg Subcutaneous Q24H   . metroNIDAZOLE  500 mg Oral Q8H  . sodium chloride  1,000 mL Intravenous Once  . sodium chloride  1,000 mL Intravenous Once  . sodium polystyrene  30 g Oral Once   Assessment: Renal failure Estimated Creatinine Clearance: 17.5 ml/min (by C-G formula based on Cr of 5.07).  Goal of Therapy:  Eradicate infection.  Plan: Cipro 400mg  iv q24hrs Labs per protocol  Valrie Hart A 02/22/2012,7:35 AM

## 2012-02-23 ENCOUNTER — Inpatient Hospital Stay (HOSPITAL_COMMUNITY): Payer: PRIVATE HEALTH INSURANCE

## 2012-02-23 DIAGNOSIS — N10 Acute tubulo-interstitial nephritis: Secondary | ICD-10-CM

## 2012-02-23 DIAGNOSIS — W57XXXA Bitten or stung by nonvenomous insect and other nonvenomous arthropods, initial encounter: Secondary | ICD-10-CM | POA: Diagnosis present

## 2012-02-23 DIAGNOSIS — N179 Acute kidney failure, unspecified: Secondary | ICD-10-CM

## 2012-02-23 DIAGNOSIS — E119 Type 2 diabetes mellitus without complications: Secondary | ICD-10-CM

## 2012-02-23 DIAGNOSIS — M79606 Pain in leg, unspecified: Secondary | ICD-10-CM | POA: Diagnosis present

## 2012-02-23 DIAGNOSIS — E875 Hyperkalemia: Secondary | ICD-10-CM

## 2012-02-23 LAB — BASIC METABOLIC PANEL
BUN: 90 mg/dL — ABNORMAL HIGH (ref 6–23)
Chloride: 110 mEq/L (ref 96–112)
GFR calc Af Amer: 19 mL/min — ABNORMAL LOW (ref >90)
Potassium: 5.5 mEq/L — ABNORMAL HIGH (ref 3.5–5.1)
Sodium: 143 mEq/L (ref 135–145)

## 2012-02-23 LAB — URINALYSIS, ROUTINE W REFLEX MICROSCOPIC
Bilirubin Urine: NEGATIVE
Nitrite: POSITIVE — AB
Protein, ur: NEGATIVE mg/dL
Specific Gravity, Urine: 1.01 (ref 1.005–1.030)
Urobilinogen, UA: 0.2 mg/dL (ref 0.0–1.0)

## 2012-02-23 LAB — URINE CULTURE
Colony Count: >=100000
Culture  Setup Time: 201305010147

## 2012-02-23 LAB — URINE MICROSCOPIC-ADD ON

## 2012-02-23 MED ORDER — SODIUM CHLORIDE 0.9 % IV SOLN
INTRAVENOUS | Status: DC
  Administered 2012-02-23 – 2012-02-25 (×5): via INTRAVENOUS

## 2012-02-23 MED ORDER — CIPROFLOXACIN HCL 250 MG PO TABS
500.0000 mg | ORAL_TABLET | Freq: Every day | ORAL | Status: DC
  Administered 2012-02-23: 500 mg via ORAL
  Filled 2012-02-23: qty 2

## 2012-02-23 NOTE — Progress Notes (Addendum)
Chart reviewed.  Subjective: C/o right thigh pain.  No back pain. Diarrhea improved. Not eating much however. No vomiting. Has had his Foley catheter in since last admission. Feels stronger. No rash  Objective: Vital signs in last 24 hours: Filed Vitals:   02/22/12 2104 02/23/12 0229 02/23/12 0521 02/23/12 1034  BP: 103/65 105/68 110/75 111/73  Pulse: 76 77 77 81  Temp: 98.3 F (36.8 C) 98.1 F (36.7 C) 98.4 F (36.9 C) 97.8 F (36.6 C)  TempSrc:    Oral  Resp: 20 20 20 20   Height:      Weight:   92.352 kg (203 lb 9.6 oz)   SpO2: 97% 99% 99% 97%   Weight change: 1.179 kg (2 lb 9.6 oz)  Intake/Output Summary (Last 24 hours) at 02/23/12 1344 Last data filed at 02/23/12 1044  Gross per 24 hour  Intake 3886.42 ml  Output   3601 ml  Net 285.42 ml   Physical Exam:  General: Nontoxic. Comfortable. Lying supine. Lungs clear to auscultation bilaterally without wheeze rhonchi or rales Cardiovascular regular rate rhythm without murmurs gallops rubs Abdomen soft nontender nondistended Back no CVA tenderness Extremities no clubbing cyanosis or edema GU: Foley catheter draining amber urine Skin: No rash around tick bite.  Lab Results: Basic Metabolic Panel:  Lab 02/22/12 0454 02/21/12 2356  NA 142 137  K 5.6* 5.1  CL 111 106  CO2 18* 16*  GLUCOSE 102* 108*  BUN 125* 131*  CREATININE 5.07* 5.07*  CALCIUM 9.1 9.1  MG 3.1* --  PHOS -- --   Liver Function Tests:  Lab 02/22/12 0518  AST 39*  ALT 60*  ALKPHOS 64  BILITOT 0.2*  PROT 6.7  ALBUMIN 2.3*   No results found for this basename: LIPASE:2,AMYLASE:2 in the last 168 hours No results found for this basename: AMMONIA:2 in the last 168 hours CBC:  Lab 02/22/12 0518 02/21/12 1615  WBC 7.6 8.9  NEUTROABS -- 5.9  HGB 10.9* 11.9*  HCT 32.9* 34.6*  MCV 89.2 86.7  PLT 197 169   Cardiac Enzymes: No results found for this basename: CKTOTAL:3,CKMB:3,CKMBINDEX:3,TROPONINI:3 in the last 168 hours BNP: No results  found for this basename: PROBNP:3 in the last 168 hours D-Dimer: No results found for this basename: DDIMER:2 in the last 168 hours CBG: No results found for this basename: GLUCAP:6 in the last 168 hours Hemoglobin A1C: No results found for this basename: HGBA1C in the last 168 hours Fasting Lipid Panel: No results found for this basename: CHOL,HDL,LDLCALC,TRIG,CHOLHDL,LDLDIRECT in the last 098 hours Thyroid Function Tests: No results found for this basename: TSH,T4TOTAL,FREET4,T3FREE,THYROIDAB in the last 168 hours Coagulation: No results found for this basename: LABPROT:4,INR:4 in the last 168 hours Anemia Panel: No results found for this basename: VITAMINB12,FOLATE,FERRITIN,TIBC,IRON,RETICCTPCT in the last 168 hours Urine Drug Screen: Drugs of Abuse  No results found for this basename: labopia, cocainscrnur, labbenz, amphetmu, thcu, labbarb    Alcohol Level: No results found for this basename: ETH:2 in the last 168 hours Urinalysis:  Lab 02/21/12 1603  COLORURINE AMBER*  LABSPEC 1.020  PHURINE 5.5  GLUCOSEU NEGATIVE  HGBUR LARGE*  BILIRUBINUR NEGATIVE  KETONESUR NEGATIVE  PROTEINUR 100*  UROBILINOGEN 0.2  NITRITE NEGATIVE  LEUKOCYTESUR MODERATE*    Micro Results: Recent Results (from the past 240 hour(s))  URINE CULTURE     Status: Normal   Collection Time   02/21/12  4:03 PM      Component Value Range Status Comment   Specimen Description URINE, CLEAN  CATCH   Final    Special Requests NONE   Final    Culture  Setup Time [id number]   Final    Colony Count >=100,000 COLONIES/ML   Final    Culture     Final    Value: Multiple bacterial morphotypes present, none predominant. Suggest appropriate recollection if clinically indicated.   Report Status 02/23/2012 FINAL   Final   CLOSTRIDIUM DIFFICILE BY PCR     Status: Normal   Collection Time   02/21/12 11:31 PM      Component Value Range Status Comment   C difficile by pcr NEGATIVE  NEGATIVE  Final     Studies/Results: Ct Abdomen Pelvis Wo Contrast  02/21/2012  *RADIOLOGY REPORT*  Clinical Data: 61 year old male with difficulty urinating.  History of renal stents.  Increasing creatinine.  CT ABDOMEN AND PELVIS WITHOUT CONTRAST  Technique:  Multidetector CT imaging of the abdomen and pelvis was performed following the standard protocol without intravenous contrast.  Comparison: 02/08/2012 and earlier.  Findings: Lung bases are clear. Stable visualized osseous structures.  Degenerative changes in the spine.  Degenerative changes of the hips.  Foley catheter now present within the bladder which is decompressed.  Bilateral double J ureteral stent now in place. Mild bilateral peri ureteral stranding.  Left ureter had is decompressed from prior.  The right UPJ and proximal ureter calculi have not significantly changed in position (series 2 images 28 and 33).  Small volume of gas within the right renal collecting system. No other right side calculus.  On the left the large lower pole intrarenal stone is stable.  No other left side calculus.  Small volume of left collecting system gas.  Superimposed chronic right renal atrophy and left nephromegaly. Mild bilateral perinephric stranding is mildly increased.  No pelvic free fluid.  Distal colon contains fluid and there are occasional diverticula again noted.  Negative proximal colon and appendix.  No dilated small bowel loops.  Fat containing umbilical hernia.  Trace oral contrast in the stomach and duodenum.  Negative noncontrast liver, gallbladder, spleen, and pancreas.  Stable bilateral adrenal thickening.  No abdominal free fluid.  IMPRESSION: 1.  Bilateral double-J ureteral stents now in place.  Interval decompression of the left ureter. 2.  Stable position of the right UPJ and proximal ureter stones. Stable large left intrarenal stone. 3.  Mild increased perinephric and periureteral stranding is nonspecific but might reflect stent placement. Small volume  bilateral renal collecting system gas is felt to be related to stent and Foley catheter placement. 4.  No other acute findings in the abdomen or pelvis.  Original Report Authenticated By: Ulla Potash III, M.D.   Scheduled Meds:   . ciprofloxacin  500 mg Oral q1800  . diltiazem  120 mg Oral Daily  . enoxaparin  30 mg Subcutaneous Q24H  . furosemide  10 mg Intravenous BID  . sodium chloride      . DISCONTD: ciprofloxacin  400 mg Intravenous Q24H  . DISCONTD: doxycycline  100 mg Oral Q12H   Continuous Infusions:   . sodium chloride 0.45 % 1,000 mL with sodium bicarbonate 50 mEq infusion 150 mL/hr at 02/23/12 1044   PRN Meds:.acetaminophen, acetaminophen, HYDROmorphone, ondansetron (ZOFRAN) IV, ondansetron, oxyCODONE, traZODone Assessment/Plan: Active Problems:  ARF (acute renal failure)  Acute pyelonephritis  Hyperkalemia  Anemia  HTN (hypertension)  Dehydration  Metabolic acidosis  Leg pain  Renal calculi  Diarrhea  Elevated LFTs Tick bite.  Creatinine was not collected today. We'll go ahead  and order a BMET today and daily. Creatinine has not improved as expected, however. Will go ahead and consult nephrology for recommendations. Patient likely has chronic kidney disease. I will discuss Foley catheter and stent management with Dr. Jesse Fall. Decrease IV fluids 100 cc an hour. Urine culture shows multiple bacterial morphotypes. Will repeat UA and culture. Continue Cipro for now. Increase activity. Encourage by mouth intake. C. difficile is negative. Tick bite has no rash of erythema migrans. No need for lyme treatment, but prophylaxis would suffice. Patient has already received 200 mg of doxycycline. Doppler leg to rule out DVT.   LOS: 2 days   Rashed Edler L 02/23/2012, 1:44 PM

## 2012-02-23 NOTE — Consult Note (Signed)
Reason for Consult: Renal failure and also her hyperkalemia.  Referring Physician: Dr. Armandina Gemma is an 61 y.o. male.  HPI: Her patient wheeze history of  bilateral kidney stone status post stent  placement  And  presently he  came  with the complaints of  poor appetite some nausea and diahrea  of a couple of days duration. Patient recently was admitted to the hospital and during that time he was found to have bilateral hydro-and   double-J ureteral was  placed. His creatinine initially was about 5 and when he was discharged his creatinine came down to 3.3. Presently when he came back his  BUN has gone up to 142 with creatinine of 5.81 and also high potassium. Presently patient says that he's feeling better he does not  have any nausea or vomiting appetite is also good.  Past Medical History  Diagnosis Date  . Hypertension   . B12 deficiency   . Hernia     umbilical  . Renal disorder     kidney stones  . Obstructive uropathy 02/08/2012  . Gross hematuria 02/09/2012    Past Surgical History  Procedure Date  . Cystoscopy w/ ureteral stent placement 02/08/2012    Procedure: CYSTOSCOPY WITH RETROGRADE PYELOGRAM/URETERAL STENT PLACEMENT;  Surgeon: Ky Barban, MD;  Location: [facility];  Service: Urology;  Laterality: Bilateral;  Bilateral Double J Stent     Family History  Problem Relation Age of Onset  . Cancer Mother   . Heart attack Father 39  . Cirrhosis Brother 40    alcoholic    Social History:  reports that he has never smoked. He does not have any smokeless tobacco history on file. He reports that he does not drink alcohol or use illicit drugs.  Allergies:  Allergies  Allergen Reactions  . Milk-Related Compounds Diarrhea    Pt states "milk doesn't agree with me" GI disturbance, loose stools    Medications: I have reviewed the patient's current medications.  Results for orders placed during the hospital encounter of 02/21/12 (from the past 48 hour(s))    BASIC METABOLIC PANEL     Status: Abnormal   Collection Time   02/21/12  7:50 PM      Component Value Range Comment   Sodium 136  135 - 145 (mEq/L)    Potassium 4.9  3.5 - 5.1 (mEq/L)    Chloride 102  96 - 112 (mEq/L)    CO2 17 (*) 19 - 32 (mEq/L)    Glucose, Bld 132 (*) 70 - 99 (mg/dL)    BUN 782 (*) 6 - 23 (mg/dL)    Creatinine, Ser 9.56 (*) 0.50 - 1.35 (mg/dL)    Calcium 9.8  8.4 - 10.5 (mg/dL)    GFR calc non Af Amer 10 (*) >90 (mL/min)    GFR calc Af Amer 12 (*) >90 (mL/min)   CLOSTRIDIUM DIFFICILE BY PCR     Status: Normal   Collection Time   02/21/12 11:31 PM      Component Value Range Comment   C difficile by pcr NEGATIVE  NEGATIVE    BASIC METABOLIC PANEL     Status: Abnormal   Collection Time   02/21/12 11:56 PM      Component Value Range Comment   Sodium 137  135 - 145 (mEq/L)    Potassium 5.1  3.5 - 5.1 (mEq/L)    Chloride 106  96 - 112 (mEq/L)    CO2 16 (*) 19 -  32 (mEq/L)    Glucose, Bld 108 (*) 70 - 99 (mg/dL)    BUN 161 (*) 6 - 23 (mg/dL)    Creatinine, Ser 0.96 (*) 0.50 - 1.35 (mg/dL)    Calcium 9.1  8.4 - 10.5 (mg/dL)    GFR calc non Af Amer 11 (*) >90 (mL/min)    GFR calc Af Amer 13 (*) >90 (mL/min)   MAGNESIUM     Status: Abnormal   Collection Time   02/22/12  5:18 AM      Component Value Range Comment   Magnesium 3.1 (*) 1.5 - 2.5 (mg/dL)   CBC     Status: Abnormal   Collection Time   02/22/12  5:18 AM      Component Value Range Comment   WBC 7.6  4.0 - 10.5 (K/uL)    RBC 3.69 (*) 4.22 - 5.81 (MIL/uL)    Hemoglobin 10.9 (*) 13.0 - 17.0 (g/dL)    HCT 04.5 (*) 40.9 - 52.0 (%)    MCV 89.2  78.0 - 100.0 (fL)    MCH 29.5  26.0 - 34.0 (pg)    MCHC 33.1  30.0 - 36.0 (g/dL)    RDW 81.1 (*) 91.4 - 15.5 (%)    Platelets 197  150 - 400 (K/uL)   COMPREHENSIVE METABOLIC PANEL     Status: Abnormal   Collection Time   02/22/12  5:18 AM      Component Value Range Comment   Sodium 142  135 - 145 (mEq/L)    Potassium 5.6 (*) 3.5 - 5.1 (mEq/L)    Chloride 111  96 -  112 (mEq/L)    CO2 18 (*) 19 - 32 (mEq/L)    Glucose, Bld 102 (*) 70 - 99 (mg/dL)    BUN 782 (*) 6 - 23 (mg/dL)    Creatinine, Ser 9.56 (*) 0.50 - 1.35 (mg/dL)    Calcium 9.1  8.4 - 10.5 (mg/dL)    Total Protein 6.7  6.0 - 8.3 (g/dL)    Albumin 2.3 (*) 3.5 - 5.2 (g/dL)    AST 39 (*) 0 - 37 (U/L)    ALT 60 (*) 0 - 53 (U/L)    Alkaline Phosphatase 64  39 - 117 (U/L)    Total Bilirubin 0.2 (*) 0.3 - 1.2 (mg/dL)    GFR calc non Af Amer 11 (*) >90 (mL/min)    GFR calc Af Amer 13 (*) >90 (mL/min)   BASIC METABOLIC PANEL     Status: Abnormal   Collection Time   02/23/12  2:02 PM      Component Value Range Comment   Sodium 143  135 - 145 (mEq/L)    Potassium 5.5 (*) 3.5 - 5.1 (mEq/L)    Chloride 110  96 - 112 (mEq/L)    CO2 22  19 - 32 (mEq/L)    Glucose, Bld 124 (*) 70 - 99 (mg/dL)    BUN 90 (*) 6 - 23 (mg/dL)    Creatinine, Ser 2.13 (*) 0.50 - 1.35 (mg/dL)    Calcium 9.3  8.4 - 10.5 (mg/dL)    GFR calc non Af Amer 17 (*) >90 (mL/min)    GFR calc Af Amer 19 (*) >90 (mL/min)   URINALYSIS, ROUTINE W REFLEX MICROSCOPIC     Status: Abnormal   Collection Time   02/23/12  2:08 PM      Component Value Range Comment   Color, Urine YELLOW  YELLOW     APPearance HAZY (*) CLEAR  Specific Gravity, Urine 1.010  1.005 - 1.030     pH 5.5  5.0 - 8.0     Glucose, UA NEGATIVE  NEGATIVE (mg/dL)    Hgb urine dipstick LARGE (*) NEGATIVE     Bilirubin Urine NEGATIVE  NEGATIVE     Ketones, ur NEGATIVE  NEGATIVE (mg/dL)    Protein, ur NEGATIVE  NEGATIVE (mg/dL)    Urobilinogen, UA 0.2  0.0 - 1.0 (mg/dL)    Nitrite POSITIVE (*) NEGATIVE     Leukocytes, UA MODERATE (*) NEGATIVE    URINE MICROSCOPIC-ADD ON     Status: Abnormal   Collection Time   02/23/12  2:08 PM      Component Value Range Comment   Squamous Epithelial / LPF RARE  RARE     WBC, UA TOO NUMEROUS TO COUNT  <3 (WBC/hpf)    RBC / HPF 11-20  <3 (RBC/hpf)    Bacteria, UA MANY (*) RARE      Ct Abdomen Pelvis Wo Contrast  02/21/2012   *RADIOLOGY REPORT*  Clinical Data: 61 year old male with difficulty urinating.  History of renal stents.  Increasing creatinine.  CT ABDOMEN AND PELVIS WITHOUT CONTRAST  Technique:  Multidetector CT imaging of the abdomen and pelvis was performed following the standard protocol without intravenous contrast.  Comparison: 02/08/2012 and earlier.  Findings: Lung bases are clear. Stable visualized osseous structures.  Degenerative changes in the spine.  Degenerative changes of the hips.  Foley catheter now present within the bladder which is decompressed.  Bilateral double J ureteral stent now in place. Mild bilateral peri ureteral stranding.  Left ureter had is decompressed from prior.  The right UPJ and proximal ureter calculi have not significantly changed in position (series 2 images 28 and 33).  Small volume of gas within the right renal collecting system. No other right side calculus.  On the left the large lower pole intrarenal stone is stable.  No other left side calculus.  Small volume of left collecting system gas.  Superimposed chronic right renal atrophy and left nephromegaly. Mild bilateral perinephric stranding is mildly increased.  No pelvic free fluid.  Distal colon contains fluid and there are occasional diverticula again noted.  Negative proximal colon and appendix.  No dilated small bowel loops.  Fat containing umbilical hernia.  Trace oral contrast in the stomach and duodenum.  Negative noncontrast liver, gallbladder, spleen, and pancreas.  Stable bilateral adrenal thickening.  No abdominal free fluid.  IMPRESSION: 1.  Bilateral double-J ureteral stents now in place.  Interval decompression of the left ureter. 2.  Stable position of the right UPJ and proximal ureter stones. Stable large left intrarenal stone. 3.  Mild increased perinephric and periureteral stranding is nonspecific but might reflect stent placement. Small volume bilateral renal collecting system gas is felt to be related to stent and  Foley catheter placement. 4.  No other acute findings in the abdomen or pelvis.  Original Report Authenticated By: Harley Hallmark, M.D.   US Venous Img Lower Unilateral Right  02/23/2012  *RADIOLOGY REPORT*  Clinical Data: Right leg pain, evaluate for DVT  RIGHT LOWER EXTREMITY VENOUS DUPLEX ULTRASOUND  Technique:  Gray-scale sonography with graded compression, as well as color Doppler and duplex ultrasound, were performed to evaluate the deep venous system of the lower extremity from the level of the common femoral vein through the popliteal and proximal calf veins. Spectral Doppler was utilized to evaluate flow at rest and with distal augmentation maneuvers.  Comparison:  None.  Findings: The visualized right lower extremity deep venous system appears patent.  Normal compressibility.  Patent color Doppler flow.  Satisfactory spectral Doppler with respiratory variation and response to augmentation.  IMPRESSION: No deep venous thrombosis in the visualized right lower extremity.  Original Report Authenticated By: Charline Bills, M.D.    Review of Systems  Respiratory: Negative for shortness of breath.   Cardiovascular: Negative for orthopnea and leg swelling.  Gastrointestinal: Positive for nausea and diarrhea.  Musculoskeletal: Positive for back pain.   Blood pressure 108/67, pulse 72, temperature 98 F (36.7 C), temperature source Oral, resp. rate 20, height 5\' 10"  (1.778 m), weight 92.352 kg (203 lb 9.6 oz), SpO2 95.00%. Physical Exam  Eyes: No scleral icterus.  Neck: Normal range of motion. Neck supple. No JVD present.  Cardiovascular: Normal rate, regular rhythm, normal heart sounds and intact distal pulses.   No murmur heard. Respiratory: No respiratory distress. He has no rales.  GI: He exhibits no distension. There is no tenderness.  Musculoskeletal: He exhibits no edema.  Skin: Skin is dry.    Assessment/Plan: Problem #1 renal failure at this moment seems to be acute on chronic.  Patient had underlying chronic renal failure possibly secondary to focal segmental nephrosclerosis associated with recurrent hydro-and kidney stone. Since patient has also previous history of infection chronic pyelonephritis also need to be entertained as a possibility. The present increasing BUN and creatinine seems to be  prerenal possibly related to poor by mouth intake, diarrhea and also possibly secondary to post obstructive diuresis. Presently his BUN and creatinine seems to be improving and patient is a symptomatic. Problem #2 hyperkalemia possibly related to the above potassium is slightly better. Problem #3 history of obstructive uropathy is a status post bilateral double-J urethral stump placement presently C. hydronephrosis seems to be decompressed. Assessment is stable however patient seems to have kidney stone which seems to be none nonobstructive. Problem #4 history of hypertension his blood pressure seems to be reasonably controlled Problem #5 history of anemia at this moment or sure whether this is anemia of chronic disease of iron deficiency anemia. Problem #6 history of metabolic acidosis that has corrected. Problem #7 history of diarrhea that has resolved. Plan : We'll put patient on low potassium diet and advised him to what foods containing high potassium.           I did with hydration and will follow up his blood work.           Her follow patient when his discharged.  Safiyyah Vasconez S 02/23/2012, 4:46 PM

## 2012-02-23 NOTE — Care Management Note (Unsigned)
    Page 1 of 1   02/23/2012     2:26:15 PM   CARE MANAGEMENT NOTE 02/23/2012  Patient:  [name], [name]   Account Number:  [id number]  Date Initiated:  02/23/2012  Documentation initiated by:  Rosemary Holms  Subjective/Objective Assessment:   pt admitted from home where he lives with his Significant Other. C/O painful urination and dx with acute renal failure.     Action/Plan:   Spoke to pt and SO at bedside. They both do not anticipate HH needs. will continue to follow.   Anticipated DC Date:  02/24/2012   Anticipated DC Plan:  HOME/SELF CARE      DC Planning Services  CM consult      Choice offered to / List presented to:             Status of service:  In process, will continue to follow Medicare Important Message given?   (If response is "NO", the following Medicare IM given date fields will be blank) Date Medicare IM given:   Date Additional Medicare IM given:    Discharge Disposition:    Per UR Regulation:    If discussed at Long Length of Stay Meetings, dates discussed:    Comments:  02/23/12 1000 Buffey Zabinski Leanord Hawking RN BSN CM

## 2012-02-23 NOTE — Progress Notes (Signed)
02/23/12 1312 Patient had loose stool this morning, c-diff PCR negative yesterday and enteric precautions d/c'd per infection prevention. Notified Dr Lendell Caprice. No further loose stools so far today.

## 2012-02-23 NOTE — Progress Notes (Signed)
PHARMACIST - PHYSICIAN COMMUNICATION DR:   Rozanna Box Hospitalist CONCERNING: Antibiotic IV to Oral Route Change Policy  RECOMMENDATION: This patient is receiving Cipro by the intravenous route.  Based on criteria approved by the Pharmacy and Therapeutics Committee, the antibiotic(s) is/are being converted to the equivalent oral dose form(s).   DESCRIPTION: These criteria include:  Patient being treated for a respiratory tract infection, urinary tract infection, or cellulitis  The patient is not neutropenic and does not exhibit a GI malabsorption state  The patient is eating (either orally or via tube) and/or has been taking other orally administered medications for a least 24 hours  The patient is improving clinically and has a Tmax < 100.5  If you have questions about this conversion, please contact the Pharmacy Department  [x]   [phone] )  [facility] []   [phone] )  [facility]  []   [phone] )  [facility] []   [phone] )  [facility]  Emily Filbert [place], [place] 02/23/2012 @ 11:00 AM

## 2012-02-23 NOTE — Progress Notes (Signed)
UR Chart Review Completed  

## 2012-02-23 NOTE — Progress Notes (Signed)
02/23/12 1707 Notified Dr Lendell Caprice of ultrasound right lower extremity results, negative for DVT.

## 2012-02-24 DIAGNOSIS — N179 Acute kidney failure, unspecified: Secondary | ICD-10-CM

## 2012-02-24 DIAGNOSIS — E875 Hyperkalemia: Secondary | ICD-10-CM

## 2012-02-24 DIAGNOSIS — E119 Type 2 diabetes mellitus without complications: Secondary | ICD-10-CM

## 2012-02-24 DIAGNOSIS — N10 Acute tubulo-interstitial nephritis: Secondary | ICD-10-CM

## 2012-02-24 LAB — CBC
MCHC: 32.8 g/dL (ref 30.0–36.0)
Platelets: 292 10*3/uL (ref 150–400)
RDW: 16.2 % — ABNORMAL HIGH (ref 11.5–15.5)
WBC: 8 10*3/uL (ref 4.0–10.5)

## 2012-02-24 LAB — BASIC METABOLIC PANEL
BUN: 74 mg/dL — ABNORMAL HIGH (ref 6–23)
Calcium: 9.2 mg/dL (ref 8.4–10.5)
Chloride: 113 mEq/L — ABNORMAL HIGH (ref 96–112)
Creatinine, Ser: 3.27 mg/dL — ABNORMAL HIGH (ref 0.50–1.35)
GFR calc Af Amer: 22 mL/min — ABNORMAL LOW (ref >90)
GFR calc non Af Amer: 19 mL/min — ABNORMAL LOW (ref >90)

## 2012-02-24 LAB — PHOSPHORUS: Phosphorus: 4.7 mg/dL — ABNORMAL HIGH (ref 2.3–4.6)

## 2012-02-24 MED ORDER — SODIUM POLYSTYRENE SULFONATE 15 GM/60ML PO SUSP
30.0000 g | Freq: Once | ORAL | Status: AC
  Administered 2012-02-24: 30 g via ORAL
  Filled 2012-02-24: qty 60

## 2012-02-24 MED ORDER — DEXTROSE 5 % IV SOLN
1.0000 g | INTRAVENOUS | Status: DC
  Administered 2012-02-24 – 2012-02-25 (×2): 1 g via INTRAVENOUS
  Filled 2012-02-24 (×3): qty 10

## 2012-02-24 MED ORDER — SODIUM CHLORIDE 0.9 % IJ SOLN
INTRAMUSCULAR | Status: AC
  Administered 2012-02-24: 10 mL
  Filled 2012-02-24: qty 3

## 2012-02-24 MED ORDER — FUROSEMIDE 10 MG/ML IJ SOLN
20.0000 mg | Freq: Two times a day (BID) | INTRAMUSCULAR | Status: DC
  Administered 2012-02-24 (×2): 20 mg via INTRAVENOUS
  Filled 2012-02-24 (×2): qty 2

## 2012-02-24 MED ORDER — SODIUM CHLORIDE 0.9 % IJ SOLN
INTRAMUSCULAR | Status: AC
  Filled 2012-02-24: qty 3

## 2012-02-24 NOTE — Progress Notes (Signed)
Subjective: Nausea and appetite has improved. Did have loose stool, but was after Kayexalate. Stronger. Leg pain better.  Objective: Vital signs in last 24 hours: Filed Vitals:   02/23/12 1402 02/23/12 2103 02/24/12 0554 02/24/12 1443  BP: 108/67 118/74 155/79 133/81  Pulse: 72 76 75 82  Temp: 98 F (36.7 C) 98.3 F (36.8 C) 98 F (36.7 C) 98.2 F (36.8 C)  TempSrc: Oral   Oral  Resp: 20 20 20 20   Height:      Weight:   93.3 kg (205 lb 11 oz)   SpO2: 95% 92% 100% 97%   Weight change: 0.948 kg (2 lb 1.4 oz)  Intake/Output Summary (Last 24 hours) at 02/24/12 1514 Last data filed at 02/24/12 1320  Gross per 24 hour  Intake 2618.33 ml  Output   4550 ml  Net -1931.67 ml   Physical Exam:  General: Nontoxic. Comfortable. Lying supine. Lungs clear to auscultation bilaterally without wheeze rhonchi or rales Cardiovascular regular rate rhythm without murmurs gallops rubs Abdomen soft nontender nondistended Back no CVA tenderness Extremities no clubbing cyanosis or edema GU: Foley catheter draining amber urine Skin: No rash around tick bite.  Lab Results: Basic Metabolic Panel:  Lab 02/24/12 1610 02/24/12 0514 02/23/12 1402 02/22/12 0518  NA -- 143 143 --  K -- 5.8* 5.5* --  CL -- 113* 110 --  CO2 -- 20 22 --  GLUCOSE -- 99 124* --  BUN -- 74* 90* --  CREATININE -- 3.27* 3.63* --  CALCIUM -- 9.2 9.3 --  MG -- -- -- 3.1*  PHOS 4.7* -- -- --   Liver Function Tests:  Lab 02/22/12 0518  AST 39*  ALT 60*  ALKPHOS 64  BILITOT 0.2*  PROT 6.7  ALBUMIN 2.3*   No results found for this basename: LIPASE:2,AMYLASE:2 in the last 168 hours No results found for this basename: AMMONIA:2 in the last 168 hours CBC:  Lab 02/24/12 0514 02/22/12 0518 02/21/12 1615  WBC 8.0 7.6 --  NEUTROABS -- -- 5.9  HGB 10.0* 10.9* --  HCT 30.5* 32.9* --  MCV 90.0 89.2 --  PLT 292 197 --   Cardiac Enzymes: No results found for this basename: CKTOTAL:3,CKMB:3,CKMBINDEX:3,TROPONINI:3  in the last 168 hours BNP: No results found for this basename: PROBNP:3 in the last 168 hours D-Dimer: No results found for this basename: DDIMER:2 in the last 168 hours CBG: No results found for this basename: GLUCAP:6 in the last 168 hours Hemoglobin A1C: No results found for this basename: HGBA1C in the last 168 hours Fasting Lipid Panel: No results found for this basename: CHOL,HDL,LDLCALC,TRIG,CHOLHDL,LDLDIRECT in the last 960 hours Thyroid Function Tests: No results found for this basename: TSH,T4TOTAL,FREET4,T3FREE,THYROIDAB in the last 168 hours Coagulation: No results found for this basename: LABPROT:4,INR:4 in the last 168 hours Anemia Panel: No results found for this basename: VITAMINB12,FOLATE,FERRITIN,TIBC,IRON,RETICCTPCT in the last 168 hours Urine Drug Screen: Drugs of Abuse  No results found for this basename: labopia,  cocainscrnur,  labbenz,  amphetmu,  thcu,  labbarb    Alcohol Level: No results found for this basename: ETH:2 in the last 168 hours Urinalysis:  Lab 02/23/12 1408 02/21/12 1603  COLORURINE YELLOW AMBER*  LABSPEC 1.010 1.020  PHURINE 5.5 5.5  GLUCOSEU NEGATIVE NEGATIVE  HGBUR LARGE* LARGE*  BILIRUBINUR NEGATIVE NEGATIVE  KETONESUR NEGATIVE NEGATIVE  PROTEINUR NEGATIVE 100*  UROBILINOGEN 0.2 0.2  NITRITE POSITIVE* NEGATIVE  LEUKOCYTESUR MODERATE* MODERATE*    Micro Results: Recent Results (from the past 240  hour(s))  URINE CULTURE     Status: Normal   Collection Time   02/21/12  4:03 PM      Component Value Range Status Comment   Specimen Description URINE, CLEAN CATCH   Final    Special Requests NONE   Final    Culture  Setup Time [id number]   Final    Colony Count >=100,000 COLONIES/ML   Final    Culture     Final    Value: Multiple bacterial morphotypes present, none predominant. Suggest appropriate recollection if clinically indicated.   Report Status 02/23/2012 FINAL   Final   CLOSTRIDIUM DIFFICILE BY PCR     Status: Normal    Collection Time   02/21/12 11:31 PM      Component Value Range Status Comment   C difficile by pcr NEGATIVE  NEGATIVE  Final    Studies/Results: US Venous Img Lower Unilateral Right  02/23/2012  *RADIOLOGY REPORT*  Clinical Data: Right leg pain, evaluate for DVT  RIGHT LOWER EXTREMITY VENOUS DUPLEX ULTRASOUND  Technique:  Gray-scale sonography with graded compression, as well as color Doppler and duplex ultrasound, were performed to evaluate the deep venous system of the lower extremity from the level of the common femoral vein through the popliteal and proximal calf veins. Spectral Doppler was utilized to evaluate flow at rest and with distal augmentation maneuvers.  Comparison:  None.  Findings: The visualized right lower extremity deep venous system appears patent.  Normal compressibility.  Patent color Doppler flow.  Satisfactory spectral Doppler with respiratory variation and response to augmentation.  IMPRESSION: No deep venous thrombosis in the visualized right lower extremity.  Original Report Authenticated By: Charline Bills, M.D.   Scheduled Meds:    . ciprofloxacin  500 mg Oral q1800  . diltiazem  120 mg Oral Daily  . enoxaparin  30 mg Subcutaneous Q24H  . furosemide  20 mg Intravenous BID  . sodium chloride      . sodium chloride      . sodium polystyrene  30 g Oral Once   Continuous Infusions:    . sodium chloride 135 mL/hr at 02/24/12 1012  . DISCONTD: sodium chloride 0.45 % 1,000 mL with sodium bicarbonate 50 mEq infusion 100 mL/hr at 02/23/12 1407   PRN Meds:.acetaminophen, acetaminophen, HYDROmorphone, ondansetron (ZOFRAN) IV, ondansetron, oxyCODONE, traZODone Assessment/Plan: Active Problems:  ARF (acute renal failure)  Acute pyelonephritis  Hyperkalemia  Anemia  HTN (hypertension)  Dehydration  Metabolic acidosis  Leg pain  Diarrhea  Elevated LFTs  Tick bite  Repeat urinalysis still shows nitrates, leukocyte esterase, many bacteria, and it too numerous to  count white blood cells. Repeat urine culture is pending. Will change to Rocephin. Continue IV fluids. Appreciate Dr. Susa Griffins assistance. Appetite and GI symptoms seem to be improving   LOS: 3 days   Bertice Risse L 02/24/2012, 3:14 PM

## 2012-02-24 NOTE — Progress Notes (Signed)
Subjective: Interval History: has no complaint of difficulty breathing. He denies any orthopnea or paroxysmal nocturnal dyspnea. At this moment appetite is good and he doesn't have any pain. Overall patient seems to be doing better..  Objective: Vital signs in last 24 hours: Temp:  [97.8 F (36.6 C)-98.3 F (36.8 C)] 98 F (36.7 C) (05/03 0554) Pulse Rate:  [72-81] 75  (05/03 0554) Resp:  [20] 20  (05/03 0554) BP: (108-155)/(67-79) 155/79 mmHg (05/03 0554) SpO2:  [92 %-100 %] 100 % (05/03 0554) Weight:  [93.3 kg (205 lb 11 oz)] 93.3 kg (205 lb 11 oz) (05/03 0554) Weight change: 0.948 kg (2 lb 1.4 oz)  Intake/Output from previous day: 05/02 0701 - 05/03 0700 In: 3750.8 [P.O.:600; I.V.:3150.8] Out: 3801 [Urine:3800; Stool:1] Intake/Output this shift: Total I/O In: 120 [P.O.:120] Out: -   General appearance: alert, cooperative and no distress Resp: clear to auscultation bilaterally Cardio: regular rate and rhythm, S1, S2 normal, no murmur, click, rub or gallop GI: soft, non-tender; bowel sounds normal; no masses,  no organomegaly Extremities: extremities normal, atraumatic, no cyanosis or edema  Lab Results:  Basename 02/24/12 0514 02/22/12 0518  WBC 8.0 7.6  HGB 10.0* 10.9*  HCT 30.5* 32.9*  PLT 292 197   BMET:  Basename 02/24/12 0514 02/23/12 1402  NA 143 143  K 5.8* 5.5*  CL 113* 110  CO2 20 22  GLUCOSE 99 124*  BUN 74* 90*  CREATININE 3.27* 3.63*  CALCIUM 9.2 9.3   No results found for this basename: PTH:2 in the last 72 hours Iron Studies: No results found for this basename: IRON,TIBC,TRANSFERRIN,FERRITIN in the last 72 hours  Studies/Results: US Venous Img Lower Unilateral Right  02/23/2012  *RADIOLOGY REPORT*  Clinical Data: Right leg pain, evaluate for DVT  RIGHT LOWER EXTREMITY VENOUS DUPLEX ULTRASOUND  Technique:  Gray-scale sonography with graded compression, as well as color Doppler and duplex ultrasound, were performed to evaluate the deep venous system  of the lower extremity from the level of the common femoral vein through the popliteal and proximal calf veins. Spectral Doppler was utilized to evaluate flow at rest and with distal augmentation maneuvers.  Comparison:  None.  Findings: The visualized right lower extremity deep venous system appears patent.  Normal compressibility.  Patent color Doppler flow.  Satisfactory spectral Doppler with respiratory variation and response to augmentation.  IMPRESSION: No deep venous thrombosis in the visualized right lower extremity.  Original Report Authenticated By: Charline Bills, M.D.    I have reviewed the patient's current medications.  Assessment/Plan: Problem #1 renal failure obstructive his pedis 74 creatinine is 3.27 improving. At this moment creatinine seems to be coming down but it was before he patient was discharged. At this moment I don't have his baseline creatinine. Patient does not have any uremic sinus symptoms. Problem #2 hyperkalemia potassium is 5.8 possibly secondary to interstitial nephritis associated with obstructive uropathy. Patient is on low potassium diet. Problem #3 metabolic acidosis CO2 is 20 has improved Problem #4 history of anemia problem is 10 hematocrit 30.5 slight decline Problem #5 history of hypertension his blood pressure seems to be controlled very well Problem #6 history of her diarrhea and nausea that has improved. He Problem #7 history of obstructive uropathy at this moment her his urine output seems to be  good he  has a Foley catheter. Plan: We'll give him Kyoxalate 30 g one dose           We'll start him on Lasix 20 mg  by mouth once a day to help [place] with potassium controlled           We'll check a stat basic metabolic panel in the morning.    LOS: 3 days   Lataja Newland S 02/24/2012,8:59 AM

## 2012-02-25 DIAGNOSIS — R197 Diarrhea, unspecified: Secondary | ICD-10-CM

## 2012-02-25 DIAGNOSIS — N179 Acute kidney failure, unspecified: Secondary | ICD-10-CM

## 2012-02-25 DIAGNOSIS — N10 Acute tubulo-interstitial nephritis: Secondary | ICD-10-CM

## 2012-02-25 LAB — BASIC METABOLIC PANEL
Calcium: 9.1 mg/dL (ref 8.4–10.5)
GFR calc Af Amer: 27 mL/min — ABNORMAL LOW (ref >90)
GFR calc non Af Amer: 23 mL/min — ABNORMAL LOW (ref >90)
Potassium: 5.2 mEq/L — ABNORMAL HIGH (ref 3.5–5.1)
Sodium: 141 mEq/L (ref 135–145)

## 2012-02-25 LAB — CBC
Hemoglobin: 9.7 g/dL — ABNORMAL LOW (ref 13.0–17.0)
MCH: 28.6 pg (ref 26.0–34.0)
Platelets: 325 10*3/uL (ref 150–400)
RBC: 3.39 MIL/uL — ABNORMAL LOW (ref 4.22–5.81)

## 2012-02-25 LAB — FERRITIN: Ferritin: 461 ng/mL — ABNORMAL HIGH (ref 22–322)

## 2012-02-25 LAB — IRON AND TIBC
Iron: 30 ug/dL — ABNORMAL LOW (ref 42–135)
TIBC: 212 ug/dL — ABNORMAL LOW (ref 215–435)
UIBC: 182 ug/dL (ref 125–400)

## 2012-02-25 MED ORDER — LOPERAMIDE HCL 2 MG PO CAPS
2.0000 mg | ORAL_CAPSULE | ORAL | Status: DC | PRN
  Administered 2012-02-25: 2 mg via ORAL
  Filled 2012-02-25: qty 1

## 2012-02-25 NOTE — Progress Notes (Signed)
Subjective: Interval History: has no complaint of nausea or vomiting. He does have also any diarrhea at this moment patient offers no complaints..  Objective: Vital signs in last 24 hours: Temp:  [97.7 F (36.5 C)-98.2 F (36.8 C)] 98 F (36.7 C) (05/04 0605) Pulse Rate:  [76-82] 76  (05/04 0605) Resp:  [18-20] 20  (05/04 0605) BP: (133-142)/(79-83) 138/83 mmHg (05/04 0605) SpO2:  [96 %-98 %] 98 % (05/04 0605) Weight:  [94 kg (207 lb 3.7 oz)] 94 kg (207 lb 3.7 oz) (05/04 0500) Weight change: 0.7 kg (1 lb 8.7 oz)  Intake/Output from previous day: 05/03 0701 - 05/04 0700 In: 1690 [P.O.:1680; I.V.:10] Out: 5825 [Urine:5825] Intake/Output this shift:    General appearance: alert, cooperative and no distress Resp: clear to auscultation bilaterally Cardio: regular rate and rhythm, S1, S2 normal, no murmur, click, rub or gallop GI: soft, non-tender; bowel sounds normal; no masses,  no organomegaly Extremities: extremities normal, atraumatic, no cyanosis or edema  Lab Results:  Basename 02/25/12 0625 02/24/12 0514  WBC 8.2 8.0  HGB 9.7* 10.0*  HCT 30.9* 30.5*  PLT 325 292   BMET:  Basename 02/25/12 0625 02/24/12 0514  NA 141 143  K 5.2* 5.8*  CL 110 113*  CO2 23 20  GLUCOSE 97 99  BUN 54* 74*  CREATININE 2.80* 3.27*  CALCIUM 9.1 9.2   No results found for this basename: PTH:2 in the last 72 hours Iron Studies: No results found for this basename: IRON,TIBC,TRANSFERRIN,FERRITIN in the last 72 hours  Studies/Results: US Venous Img Lower Unilateral Right  02/23/2012  *RADIOLOGY REPORT*  Clinical Data: Right leg pain, evaluate for DVT  RIGHT LOWER EXTREMITY VENOUS DUPLEX ULTRASOUND  Technique:  Gray-scale sonography with graded compression, as well as color Doppler and duplex ultrasound, were performed to evaluate the deep venous system of the lower extremity from the level of the common femoral vein through the popliteal and proximal calf veins. Spectral Doppler was utilized  to evaluate flow at rest and with distal augmentation maneuvers.  Comparison:  None.  Findings: The visualized right lower extremity deep venous system appears patent.  Normal compressibility.  Patent color Doppler flow.  Satisfactory spectral Doppler with respiratory variation and response to augmentation.  IMPRESSION: No deep venous thrombosis in the visualized right lower extremity.  Original Report Authenticated By: Charline Bills, M.D.    I have reviewed the patient's current medications.  Assessment/Plan: Problem #1 renal failure at this moment seems to be acute on chronic his BUN is 54 creatinine is 2.8 renal function continued to improve. Presently her patient is none oliguric. And doesn't have any sinus symptoms of her uremia. Problem #2 hyperkalemia his potassium is 5.2 improving Problem #3 history of anemia etiology as this moment is not clear his hemoglobin is 9.7 hematocrit 30.9 possibly a compression of iron deficiency and anemia of chronic disease. His iron studies are pending. 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 Problem #5 history of hypertension his blood pressure seems to be controlled very well Problem #6 history of diarrhea that has improved.  Problem #7 history of her hyperphosphatemia mild associated with his renal failure. Plan: We'll DC Foley catheter and see if her patient come continue without it           We'll DC Lasix           We'll check be met in the morning if the potassium remains stable or improved patient  can be discharged with low potassium diet otherwise patient may require kayoxalate for a short period possibly once a week.  Her follow patient when his discharged and will check his basic metabolic panel in the morning.   LOS: 4 days   Lovely Kerins S 02/25/2012,7:18 AM

## 2012-02-25 NOTE — Progress Notes (Signed)
Subjective: Nausea and appetite has improved. Had a few loose stools today. Urinating fine without Foley  Objective: Vital signs in last 24 hours: Filed Vitals:   02/24/12 1443 02/24/12 2155 02/25/12 0500 02/25/12 0605  BP: 133/81 142/79  138/83  Pulse: 82 80  76  Temp: 98.2 F (36.8 C) 97.7 F (36.5 C)  98 F (36.7 C)  TempSrc: Oral Oral  Oral  Resp: 20 18  20   Height:      Weight:   94 kg (207 lb 3.7 oz)   SpO2: 97% 96%  98%   Weight change: 0.7 kg (1 lb 8.7 oz)  Intake/Output Summary (Last 24 hours) at 02/25/12 1414 Last data filed at 02/25/12 1225  Gross per 24 hour  Intake   1885 ml  Output   5125 ml  Net  -3240 ml   Physical Exam:  General: Nontoxic. Comfortable. Lying supine. Lungs clear to auscultation bilaterally without wheeze rhonchi or rales Cardiovascular regular rate rhythm without murmurs gallops rubs Abdomen soft nontender nondistended Back no CVA tenderness Extremities no clubbing cyanosis or edema GU: Foley catheter draining amber urine Skin: No rash around tick bite.  Lab Results: Basic Metabolic Panel:  Lab 02/25/12 1610 02/24/12 0516 02/24/12 0514 02/22/12 0518  NA 141 -- 143 --  K 5.2* -- 5.8* --  CL 110 -- 113* --  CO2 23 -- 20 --  GLUCOSE 97 -- 99 --  BUN 54* -- 74* --  CREATININE 2.80* -- 3.27* --  CALCIUM 9.1 -- 9.2 --  MG -- -- -- 3.1*  PHOS -- 4.7* -- --   Liver Function Tests:  Lab 02/22/12 0518  AST 39*  ALT 60*  ALKPHOS 64  BILITOT 0.2*  PROT 6.7  ALBUMIN 2.3*   No results found for this basename: LIPASE:2,AMYLASE:2 in the last 168 hours No results found for this basename: AMMONIA:2 in the last 168 hours CBC:  Lab 02/25/12 0625 02/24/12 0514 02/21/12 1615  WBC 8.2 8.0 --  NEUTROABS -- -- 5.9  HGB 9.7* 10.0* --  HCT 30.9* 30.5* --  MCV 91.2 90.0 --  PLT 325 292 --   Cardiac Enzymes: No results found for this basename: CKTOTAL:3,CKMB:3,CKMBINDEX:3,TROPONINI:3 in the last 168 hours BNP: No results found for  this basename: PROBNP:3 in the last 168 hours D-Dimer: No results found for this basename: DDIMER:2 in the last 168 hours CBG: No results found for this basename: GLUCAP:6 in the last 168 hours Hemoglobin A1C: No results found for this basename: HGBA1C in the last 168 hours Fasting Lipid Panel: No results found for this basename: CHOL,HDL,LDLCALC,TRIG,CHOLHDL,LDLDIRECT in the last 960 hours Thyroid Function Tests: No results found for this basename: TSH,T4TOTAL,FREET4,T3FREE,THYROIDAB in the last 168 hours Coagulation: No results found for this basename: LABPROT:4,INR:4 in the last 168 hours Anemia Panel: No results found for this basename: VITAMINB12,FOLATE,FERRITIN,TIBC,IRON,RETICCTPCT in the last 168 hours Urine Drug Screen: Drugs of Abuse  No results found for this basename: labopia,  cocainscrnur,  labbenz,  amphetmu,  thcu,  labbarb    Alcohol Level: No results found for this basename: ETH:2 in the last 168 hours Urinalysis:  Lab 02/23/12 1408 02/21/12 1603  COLORURINE YELLOW AMBER*  LABSPEC 1.010 1.020  PHURINE 5.5 5.5  GLUCOSEU NEGATIVE NEGATIVE  HGBUR LARGE* LARGE*  BILIRUBINUR NEGATIVE NEGATIVE  KETONESUR NEGATIVE NEGATIVE  PROTEINUR NEGATIVE 100*  UROBILINOGEN 0.2 0.2  NITRITE POSITIVE* NEGATIVE  LEUKOCYTESUR MODERATE* MODERATE*    Micro Results: Recent Results (from the past 240 hour(s))  URINE CULTURE     Status: Normal   Collection Time   02/21/12  4:03 PM      Component Value Range Status Comment   Specimen Description URINE, CLEAN CATCH   Final    Special Requests NONE   Final    Culture  Setup Time [id number]   Final    Colony Count >=100,000 COLONIES/ML   Final    Culture     Final    Value: Multiple bacterial morphotypes present, none predominant. Suggest appropriate recollection if clinically indicated.   Report Status 02/23/2012 FINAL   Final   CLOSTRIDIUM DIFFICILE BY PCR     Status: Normal   Collection Time   02/21/12 11:31 PM      Component  Value Range Status Comment   C difficile by pcr NEGATIVE  NEGATIVE  Final   URINE CULTURE     Status: Normal (Preliminary result)   Collection Time   02/23/12  2:08 PM      Component Value Range Status Comment   Specimen Description URINE, CLEAN CATCH   Final    Special Requests NONE   Final    Culture  Setup Time [id number]   Final    Colony Count PENDING   Incomplete    Culture Culture reincubated for better growth   Final    Report Status PENDING   Incomplete    Studies/Results: US Venous Img Lower Unilateral Right  02/23/2012  *RADIOLOGY REPORT*  Clinical Data: Right leg pain, evaluate for DVT  RIGHT LOWER EXTREMITY VENOUS DUPLEX ULTRASOUND  Technique:  Gray-scale sonography with graded compression, as well as color Doppler and duplex ultrasound, were performed to evaluate the deep venous system of the lower extremity from the level of the common femoral vein through the popliteal and proximal calf veins. Spectral Doppler was utilized to evaluate flow at rest and with distal augmentation maneuvers.  Comparison:  None.  Findings: The visualized right lower extremity deep venous system appears patent.  Normal compressibility.  Patent color Doppler flow.  Satisfactory spectral Doppler with respiratory variation and response to augmentation.  IMPRESSION: No deep venous thrombosis in the visualized right lower extremity.  Original Report Authenticated By: Charline Bills, M.D.   Scheduled Meds:    . cefTRIAXone (ROCEPHIN)  IV  1 g Intravenous Q24H  . diltiazem  120 mg Oral Daily  . enoxaparin  30 mg Subcutaneous Q24H  . sodium chloride      . DISCONTD: ciprofloxacin  500 mg Oral q1800  . DISCONTD: furosemide  20 mg Intravenous BID   Continuous Infusions:    . sodium chloride 135 mL/hr at 02/25/12 1134   PRN Meds:.acetaminophen, acetaminophen, HYDROmorphone, ondansetron (ZOFRAN) IV, ondansetron, oxyCODONE, traZODone Assessment/Plan: Active Problems:  ARF (acute renal failure)  Acute  pyelonephritis  Hyperkalemia  Anemia  HTN (hypertension)  Dehydration  Metabolic acidosis  Leg pain  Diarrhea  Elevated LFTs  Tick bite  Agree with plans. Imodium as needed. Probably home tomorrow.   LOS: 4 days   Zayden Hahne L 02/25/2012, 2:14 PM

## 2012-02-26 DIAGNOSIS — N179 Acute kidney failure, unspecified: Secondary | ICD-10-CM

## 2012-02-26 DIAGNOSIS — E875 Hyperkalemia: Secondary | ICD-10-CM

## 2012-02-26 DIAGNOSIS — N10 Acute tubulo-interstitial nephritis: Secondary | ICD-10-CM

## 2012-02-26 LAB — BASIC METABOLIC PANEL
BUN: 35 mg/dL — ABNORMAL HIGH (ref 6–23)
CO2: 20 mEq/L (ref 19–32)
Calcium: 9.1 mg/dL (ref 8.4–10.5)
Chloride: 109 mEq/L (ref 96–112)
Creatinine, Ser: 2.32 mg/dL — ABNORMAL HIGH (ref 0.50–1.35)
GFR calc Af Amer: 33 mL/min — ABNORMAL LOW (ref >90)

## 2012-02-26 LAB — URINE CULTURE

## 2012-02-26 MED ORDER — SODIUM POLYSTYRENE SULFONATE 15 GM/60ML PO SUSP
30.0000 g | ORAL | Status: AC
  Administered 2012-02-26: 30 g via ORAL
  Filled 2012-02-26: qty 120

## 2012-02-26 MED ORDER — SODIUM POLYSTYRENE SULFONATE PO POWD: ORAL | Status: AC

## 2012-02-26 MED ORDER — FUROSEMIDE 20 MG PO TABS: 20.0000 mg | ORAL_TABLET | Freq: Every day | ORAL | Status: DC

## 2012-02-26 MED ORDER — AMPICILLIN 500 MG PO CAPS: 500.0000 mg | ORAL_CAPSULE | Freq: Four times a day (QID) | ORAL | Status: AC

## 2012-02-26 MED ORDER — FUROSEMIDE 20 MG PO TABS
20.0000 mg | ORAL_TABLET | Freq: Every day | ORAL | Status: DC
  Administered 2012-02-26: 20 mg via ORAL
  Filled 2012-02-26: qty 1

## 2012-02-26 MED ORDER — ENOXAPARIN SODIUM 40 MG/0.4ML ~~LOC~~ SOLN: 40.0000 mg | Freq: Every day | SUBCUTANEOUS | Status: DC

## 2012-02-26 MED ORDER — LOPERAMIDE HCL 2 MG PO CAPS: 2.0000 mg | ORAL_CAPSULE | ORAL | Status: AC | PRN

## 2012-02-26 NOTE — Progress Notes (Signed)
Patient received discharge instructions along with follow up appointments and prescriptions. Patient verbalized understanding of all instructions. Patient was escorted by staff via wheelchair to vehicle. Patient discharged to home in stable condition. 

## 2012-02-26 NOTE — Discharge Summary (Signed)
Physician Discharge Summary  Patient ID: [name] MRN: [id number] DOB/AGE: 61/30/1952 61 y.o.  Admit date: 02/21/2012 Discharge date: 02/26/2012  Discharge Diagnoses:  Active Problems:  ARF (acute renal failure)  Acute pyelonephritis, enterococcus species growing, sensitivities pending.  Hyperkalemia  Anemia  HTN (hypertension)  Dehydration  Metabolic acidosis  Leg pain  Diarrhea  Elevated LFTs  Tick bite   Medication List  As of 02/26/2012 10:40 AM   STOP taking these medications         DSS 100 MG Caps      HYDROcodone-acetaminophen 5-325 MG per tablet         TAKE these medications         ampicillin 500 MG capsule   Commonly known as: PRINCIPEN   Take 1 capsule (500 mg total) by mouth 4 (four) times daily.      cyanocobalamin 1000 MCG/ML injection   Commonly known as: (VITAMIN B-12)   Inject 1,000 mcg into the muscle every 30 (thirty) days.      diltiazem 120 MG tablet   Commonly known as: CARDIZEM   Take 1 tablet (120 mg total) by mouth daily. FOR HIGH BLOOD PRESSURE.      furosemide 20 MG tablet   Commonly known as: LASIX   Take 1 tablet (20 mg total) by mouth daily.      loperamide 2 MG capsule   Commonly known as: IMODIUM   Take 1 capsule (2 mg total) by mouth as needed for diarrhea or loose stools.      sodium polystyrene powder   Commonly known as: KAYEXALATE   Take by mouth once a week.            Discharge Orders    Future Orders Please Complete By Expires   Discharge instructions      Comments:   Low potassium diet. Plenty of fluids   Activity as tolerated - No restrictions         Follow-up Information    Follow up with [name]. Schedule an appointment as soon as possible for a visit in 2 weeks.   Contact information:   [address] [place] [address] [phone]       Follow up with [name]. (tomorrow, as previously scheduled)    Contact information:   [address] [place] [address] [phone]         tests pending at the time of discharge: Final urine culture results.  Disposition: 01-Home or Self Care  Discharged Condition:   Consults: Treatment Team:  Clinton Mead, Fritz  Labs:   05/03 05/04 05/05   24 Hrs: 0700 0700 0700 0700 0700 0700 0700 0700 0700 0700  0700     CHEM PROFILE   Sodium      142  143 141 138    Potassium      5.6  5.8 5.2 5.4    Chloride      111  113 110 109    CO2      18  20 23 20     BUN      125  74 54 35    Creatinine, Ser      5.07  3.27 2.80 2.32    Calcium      9.1  9.2 9.1 9.1    GFR calc non Af Amer      11  19 23 29     GFR calc Af [name]  13  22 27  33    Glucose, Bld      102  99 97 99    Phosphorus        4.7  3.4    Magnesium      3.1        Alkaline Phosphatase      64        Albumin      2.3        AST      39        ALT      60        Total Protein      6.7        Total Bilirubin      0.2        IRON /ANEMIA PROFILE   Iron         30     UIBC         182     TIBC         212     Saturation Ratios         14     Ferritin         461     CBC   WBC      7.6  8.0 8.2     RBC      3.69  3.39 3.39     Hemoglobin      10.9  10.0 9.7     HCT      32.9  30.5 30.9     MCV      89.2  90.0 91.2     MCH      29.5  29.5 28.6     MCHC      33.1  32.8 31.4     RDW      16.1  16.2 16.1     Platelets      197  292 325     DIFFERENTIAL   Neutrophils Relative      67        Lymphocytes Relative      21        Monocytes Relative      10        Eosinophils Relative      2        Basophils Relative      0        Neutro Abs      5.9        Lymphs Abs      1.9        Monocytes Absolute      0.9        Eosinophils Absolute      0.2        Basophils Absolute      0.0        WBC Morphology      ATYPICAL LYMPHOCYTES        Smear Review      LARGE PLATELETS PRESENT        DIABETES   Glucose, Bld      102  99 97 99    URINALYSIS   Color, Urine      AMBER  YELLOW        APPearance      CLOUDY  HAZY      Specific Gravity, Urine      1.020  1.010      pH  5.5  5.5      Glucose, UA      NEGATIVE  NEGATIVE      Bilirubin Urine      NEGATIVE  NEGATIVE      Ketones, ur      NEGATIVE  NEGATIVE      Protein, ur      100  NEGATIVE      Urobilinogen, UA      0.2  0.2      Nitrite      NEGATIVE  POSITIVE      Leukocytes, UA      MODERATE  MODERATE      Hgb urine dipstick      LARGE  LARGE      WBC, UA      TOO NUMEROUS TO COUNT  TOO NUMEROUS TO COUNT      RBC / HPF      TOO NUMEROUS TO COUNT  11-20      Squamous Epithelial / LPF      FEW  RARE      Bacteria, UA      MANY  MANY      STOOL TESTS   C difficile by pcr      NEGATIVE   Diagnostics:  Ct Abdomen Pelvis Wo Contrast  02/21/2012  *RADIOLOGY REPORT*  Clinical Data: 61 year old male with difficulty urinating.  History of renal stents.  Increasing creatinine.  CT ABDOMEN AND PELVIS WITHOUT CONTRAST  Technique:  Multidetector CT imaging of the abdomen and pelvis was performed following the standard protocol without intravenous contrast.  Comparison: 02/08/2012 and earlier.  Findings: Lung bases are clear. Stable visualized osseous structures.  Degenerative changes in the spine.  Degenerative changes of the hips.  Foley catheter now present within the bladder which is decompressed.  Bilateral double J ureteral stent now in place. Mild bilateral peri ureteral stranding.  Left ureter had is decompressed from prior.  The right UPJ and proximal ureter calculi have not significantly changed in position (series 2 images 28 and 33).  Small volume of gas within the right renal collecting system. No other right side calculus.  On the left the large lower pole intrarenal stone is stable.  No other left side calculus.  Small volume of left collecting system gas.  Superimposed chronic right renal atrophy and left nephromegaly. Mild bilateral perinephric stranding is mildly increased.  No pelvic free fluid.  Distal colon  contains fluid and there are occasional diverticula again noted.  Negative proximal colon and appendix.  No dilated small bowel loops.  Fat containing umbilical hernia.  Trace oral contrast in the stomach and duodenum.  Negative noncontrast liver, gallbladder, spleen, and pancreas.  Stable bilateral adrenal thickening.  No abdominal free fluid.  IMPRESSION: 1.  Bilateral double-J ureteral stents now in place.  Interval decompression of the left ureter. 2.  Stable position of the right UPJ and proximal ureter stones. Stable large left intrarenal stone. 3.  Mild increased perinephric and periureteral stranding is nonspecific but might reflect stent placement. Small volume bilateral renal collecting system gas is felt to be related to stent and Foley catheter placement. 4.  No other acute findings in the abdomen or pelvis.  Original Report Authenticated By: Harley Hallmark, M.D.   Ct Abdomen Pelvis Wo Contrast  02/07/2012  *RADIOLOGY REPORT*  Clinical Data: Nausea.  Recent passage of kidney stones.  CT ABDOMEN AND PELVIS WITHOUT CONTRAST  Technique:  Multidetector CT imaging of the abdomen and pelvis was  performed following the standard protocol without intravenous contrast.  Comparison: 01/17/2012  Findings: A left kidney lower pole nonobstructive calculus measures 1.8 x 1.2 cm on image 32 of series 2.  Right renal atrophy noted.  A 0.9 x 0.5 cm right collecting system calculus is present and there is a 0.5 cm right UVJ calculus which currently appears nonobstructive.  There is reduced hydronephrosis on the left compared the prior exam.  There is continued left hydroureter, that the large left distal ureteral calculus shown on the prior exam is no longer readily apparent.  There is a transitional in left ureteral caliber in the distal ureter.  The right-sided previously seen distal ureteral calculus is no longer visible. 10.  No calculus is visible in the urinary bladder.  Stable fullness of the lateral limb of the  left adrenal gland noted.  An umbilical hernia contains adipose tissue.  The appendix appears normal. Bridging spurring of the sacroiliac joints noted.  Disc bulges noted at the L3-4, L4-5, and L5-S1 levels.  IMPRESSION:  1.  The previously seen distal ureteral calculi are no longer readily apparent.  The left hydronephrosis has resolved, but there is continued left hydroureter extending down to the distal ureter. 2.  Large left kidney lower pole nonobstructive calculus, stable. 3.  5 mm right UVJ calculus is not currently causing hydronephrosis.  There is also a 9 mm right collecting system calculus. 4.  Umbilical hernia contains adipose tissue. 5.  Lower lumbar spondylosis.  Original Report Authenticated By: Dellia Cloud, M.D.   Dg Retrograde Pyelogram  02/09/2012  *RADIOLOGY REPORT*  Clinical Data: Bilateral stent placement and stone removal.  RETROGRADE PYELOGRAM  Comparison: None.  Findings: Fluoroscopic images are submitted from a retrograde urethrogram.  The left ureter is cannulated.  The left ureter is dilated.  Grade III hydronephrosis is present with blunting of the calyces.  The right ureter is cannulated.  The ureter is of more normal caliber. Grade III hydronephrosis is present on the right as well. There is a focal filling defect in the proximal right ureter which may represent an obstructing stone.  Spot images demonstrate bilateral ureteral stents.  The right sided upper loop was formed in the pelvis.  Both loops are formed in the urinary bladder.  The only image provided of the left kidney shows a non looped stent extending into the upper pole calix.  IMPRESSION:  1.  Bilateral grade 3 hydronephrosis. 2.  Interval placement of bilateral double J ureteral stents. There is no image provided of the fully formed loop in the left kidney.  Original Report Authenticated By: Jamesetta Orleans. MATTERN, M.D.   US Venous Img Lower Unilateral Right  02/23/2012  *RADIOLOGY REPORT*  Clinical Data:  Right leg pain, evaluate for DVT  RIGHT LOWER EXTREMITY VENOUS DUPLEX ULTRASOUND  Technique:  Gray-scale sonography with graded compression, as well as color Doppler and duplex ultrasound, were performed to evaluate the deep venous system of the lower extremity from the level of the common femoral vein through the popliteal and proximal calf veins. Spectral Doppler was utilized to evaluate flow at rest and with distal augmentation maneuvers.  Comparison:  None.  Findings: The visualized right lower extremity deep venous system appears patent.  Normal compressibility.  Patent color Doppler flow.  Satisfactory spectral Doppler with respiratory variation and response to augmentation.  IMPRESSION: No deep venous thrombosis in the visualized right lower extremity.  Original Report Authenticated By: Charline Bills, M.D.    Procedures: None  EKG:  Normal sinus rhythm Nonspecific T wave abnormality  Full Code   Hospital Course:  See H&P for complete admission details. The patient is a pleasant 61 year old black male who presented with weakness and poor appetite. He had had recent bilateral ureteral stent placement for acute renal failure, hydronephrosis and ureteral lithiasis. His creatinine was high during previous elevations, felt to be obstructive uropathy. His creatinine had decreased to 3.3 at the time of discharge. He had been sent home with a Foley catheter, opiate analgesics and stool softeners. At home, he began having diarrhea and abdominal pain. He went to see Dr. Jesse Fall who apparently did some labs and send him to the emergency room for abnormal basic metabolic panel. He had a creatinine of 5.8, and a potassium of 5.8. Urinalysis showed large blood, and protein, moderate leukocyte esterase, too numerous to count white cells, too numerous to count red cells, and many bacteria. Normal white blood cell count. CAT scan of the kidneys without contrast showed bilateral ureteral stents and interval  decompression of the left ureter, stable position of the right UPJ and proximal ureter are all stones with mild increase in perinephric and periureteral stranding  Patient was admitted for treatment of his pyelonephritis, acute renal failure and hyperkalemia. His baseline creatinine is unknown. He was started on Cipro, IV fluids, supportive care. C. difficile was ruled out. His hyperkalemia was treated medically. His creatinine was very slow to improve, and nephrology was consulted. Unfortunately, a urine culture was not sent on admission. Repeat urine showed continued pyuria and bacteriuria. Culture taken several days into the admission is growing 80,000 colonies of enterococcus species with sensitivities pending. His antibiotics were switched to Rocephin after repeat urine showed no improvement. His creatinine is improving but his potassium remains slightly high. Nephrology has recommended weekly Kayexalate, Lasix, liberalize salt diet, and outpatient followup. His Foley catheter has been removed and he is urinating fine. His appetite is now back to baseline. He has had intermittent loose stool but has received several doses of Kayexalate. He has an appointment previously scheduled with Dr. Jesse Fall tomorrow which have asked him to keep. He will need his final urine culture results followed up. I sent him home on ampicillin for the enterococcus UTI. Patient was given extensive teaching about low potassium diet. His EKG showed no acute changes of hyperkalemia. He's ambulating and feeling much better. He did experience some right leg pain and Doppler of the leg was negative for DVT. Total time on the day of discharge is greater than 30 minutes. Discharge Exam:  Blood pressure 134/75, pulse 76, temperature 98.1 F (36.7 C), temperature source Oral, resp. rate 20, height 5\' 10"  (1.778 m), weight 93.6 kg (206 lb 5.6 oz), SpO2 96.00%. Unchanged from 02/25/2012   Signed: Crista Curb L 02/26/2012, 10:40  AM

## 2012-02-26 NOTE — Progress Notes (Signed)
Subjective: Interval History: has no complaint of difficulty in breathing. Patient doesn't have any nausea vomiting. Presently he doesn't offer any complaints.  Objective: Vital signs in last 24 hours: Temp:  [98.1 F (36.7 C)-98.7 F (37.1 C)] 98.1 F (36.7 C) (05/05 0551) Pulse Rate:  [70-76] 76  (05/05 0551) Resp:  [20] 20  (05/05 0551) BP: (134-145)/(75) 134/75 mmHg (05/05 0551) SpO2:  [96 %-97 %] 96 % (05/05 0551) Weight:  [93.6 kg (206 lb 5.6 oz)] 93.6 kg (206 lb 5.6 oz) (05/05 0457) Weight change: -0.4 kg (-14.1 oz)  Intake/Output from previous day: 05/04 0701 - 05/05 0700 In: 2785 [P.O.:1165; I.V.:1620] Out: 2400 [Urine:2400] Intake/Output this shift:    General appearance: alert, cooperative and no distress Resp: clear to auscultation bilaterally Cardio: regular rate and rhythm, S1, S2 normal, no murmur, click, rub or gallop GI: soft, non-tender; bowel sounds normal; no masses,  no organomegaly Extremities: extremities normal, atraumatic, no cyanosis or edema  Lab Results:  [facility] 02/25/12 0625 02/24/12 0514  WBC 8.2 8.0  HGB 9.7* 10.0*  HCT 30.9* 30.5*  PLT 325 292   BMET:  Basename 02/26/12 0456 02/25/12 0625  NA 138 141  K 5.4* 5.2*  CL 109 110  CO2 20 23  GLUCOSE 99 97  BUN 35* 54*  CREATININE 2.32* 2.80*  CALCIUM 9.1 9.1   No results found for this basename: PTH:2 in the last 72 hours Iron Studies:  Basename 02/25/12 0625  IRON 30*  TIBC 212*  TRANSFERRIN --  FERRITIN 461*    Studies/Results: No results found.  I have reviewed the patient's current medications.  Assessment/Plan: Problem #1 renal failure acute on chronic his BUN is 35 creatinine is 2.3 to progressively improving. Patient is none oliguric and folly catheter Has Been Removed. Problem #2 Hyperkalemia Potassium 5.4  Still Increasing. Problem #3 History of Hypertension His Blood Pressure Is Controlled Very Well Problem #4 anemia iron saturation is 14% low however his  ferritin is 461. At this moment patient seems to have iron deficiency anemia. Problem #5 history of kidney stone patient being followed by Dr.J avid. Problem #6 history of metabolic acidosis that has been corrected. Problem #7 history of hyperphosphatemia his phosphorus 3.4 that also has corrected. Plan: Because of increasing potassium patient may benefit from low potassium diet and also Kyoxalate once a week, liberalize salt intake and Lasix 20 mg by mouth once a day. Her follow patient in 2 weeks if is going to be discharged and make adjustment on his medication.     LOS: 5 days   Dashiel Bergquist S 02/26/2012,7:56 AM

## 2012-02-27 NOTE — Progress Notes (Signed)
Discharge summary sent to payer through [facility]  

## 2012-04-18 ENCOUNTER — Ambulatory Visit: Payer: PRIVATE HEALTH INSURANCE | Admitting: Gastroenterology

## 2012-05-08 ENCOUNTER — Ambulatory Visit: Payer: PRIVATE HEALTH INSURANCE | Admitting: Gastroenterology

## 2012-09-04 ENCOUNTER — Other Ambulatory Visit (HOSPITAL_COMMUNITY): Payer: Self-pay | Admitting: Urology

## 2012-09-04 DIAGNOSIS — N200 Calculus of kidney: Secondary | ICD-10-CM

## 2012-09-07 ENCOUNTER — Ambulatory Visit (HOSPITAL_COMMUNITY)
Admission: RE | Admit: 2012-09-07 | Discharge: 2012-09-07 | Disposition: A | Discharge status: Home / Self Care | Payer: Self-pay | Source: Ambulatory Visit | Attending: Urology | Admitting: Urology

## 2012-09-07 DIAGNOSIS — K7689 Other specified diseases of liver: Secondary | ICD-10-CM | POA: Insufficient documentation

## 2012-09-07 DIAGNOSIS — N200 Calculus of kidney: Secondary | ICD-10-CM

## 2012-09-07 DIAGNOSIS — K429 Umbilical hernia without obstruction or gangrene: Secondary | ICD-10-CM | POA: Insufficient documentation

## 2012-09-07 DIAGNOSIS — N201 Calculus of ureter: Secondary | ICD-10-CM | POA: Insufficient documentation

## 2012-09-11 ENCOUNTER — Ambulatory Visit (HOSPITAL_COMMUNITY)
Admission: RE | Admit: 2012-09-11 | Discharge: 2012-09-11 | Disposition: A | Discharge status: Home / Self Care | Payer: Self-pay | Source: Ambulatory Visit | Attending: Urology | Admitting: Urology

## 2012-09-11 ENCOUNTER — Other Ambulatory Visit (HOSPITAL_COMMUNITY): Payer: Self-pay | Admitting: Urology

## 2012-09-11 DIAGNOSIS — N200 Calculus of kidney: Secondary | ICD-10-CM

## 2012-12-27 ENCOUNTER — Ambulatory Visit (HOSPITAL_COMMUNITY)
Admission: RE | Admit: 2012-12-27 | Discharge: 2012-12-27 | Disposition: A | Discharge status: Home / Self Care | Payer: Disability Insurance | Source: Ambulatory Visit | Attending: Family Medicine | Admitting: Family Medicine

## 2012-12-27 ENCOUNTER — Other Ambulatory Visit (HOSPITAL_COMMUNITY): Payer: Self-pay | Admitting: *Deleted

## 2012-12-27 DIAGNOSIS — M5137 Other intervertebral disc degeneration, lumbosacral region: Secondary | ICD-10-CM | POA: Insufficient documentation

## 2012-12-27 DIAGNOSIS — M545 Low back pain, unspecified: Secondary | ICD-10-CM | POA: Insufficient documentation

## 2012-12-27 DIAGNOSIS — M549 Dorsalgia, unspecified: Secondary | ICD-10-CM

## 2012-12-27 DIAGNOSIS — M47817 Spondylosis without myelopathy or radiculopathy, lumbosacral region: Secondary | ICD-10-CM | POA: Insufficient documentation

## 2012-12-27 DIAGNOSIS — M51379 Other intervertebral disc degeneration, lumbosacral region without mention of lumbar back pain or lower extremity pain: Secondary | ICD-10-CM | POA: Insufficient documentation

## 2013-01-31 ENCOUNTER — Other Ambulatory Visit (HOSPITAL_COMMUNITY): Payer: Self-pay | Admitting: Urology

## 2013-01-31 ENCOUNTER — Ambulatory Visit (HOSPITAL_COMMUNITY)
Admission: RE | Admit: 2013-01-31 | Discharge: 2013-01-31 | Disposition: A | Discharge status: Home / Self Care | Payer: Self-pay | Source: Ambulatory Visit | Attending: Urology | Admitting: Urology

## 2013-01-31 DIAGNOSIS — N200 Calculus of kidney: Secondary | ICD-10-CM

## 2013-01-31 DIAGNOSIS — N201 Calculus of ureter: Secondary | ICD-10-CM | POA: Insufficient documentation

## 2013-01-31 DIAGNOSIS — Z09 Encounter for follow-up examination after completed treatment for conditions other than malignant neoplasm: Secondary | ICD-10-CM | POA: Insufficient documentation

## 2013-02-04 ENCOUNTER — Other Ambulatory Visit (HOSPITAL_COMMUNITY): Payer: Self-pay | Admitting: Urology

## 2013-02-04 DIAGNOSIS — N200 Calculus of kidney: Secondary | ICD-10-CM

## 2013-02-07 ENCOUNTER — Ambulatory Visit (HOSPITAL_COMMUNITY)
Admission: RE | Admit: 2013-02-07 | Discharge: 2013-02-07 | Disposition: A | Discharge status: Home / Self Care | Payer: Self-pay | Source: Ambulatory Visit | Attending: Urology | Admitting: Urology

## 2013-02-07 DIAGNOSIS — Z09 Encounter for follow-up examination after completed treatment for conditions other than malignant neoplasm: Secondary | ICD-10-CM | POA: Insufficient documentation

## 2013-02-07 DIAGNOSIS — N200 Calculus of kidney: Secondary | ICD-10-CM | POA: Insufficient documentation

## 2013-02-07 DIAGNOSIS — N269 Renal sclerosis, unspecified: Secondary | ICD-10-CM | POA: Insufficient documentation

## 2013-02-07 DIAGNOSIS — K429 Umbilical hernia without obstruction or gangrene: Secondary | ICD-10-CM | POA: Insufficient documentation

## 2013-02-15 ENCOUNTER — Encounter (HOSPITAL_COMMUNITY): Payer: Self-pay | Admitting: Pharmacy Technician

## 2013-02-20 ENCOUNTER — Encounter (HOSPITAL_COMMUNITY)
Admission: RE | Admit: 2013-02-20 | Discharge: 2013-02-20 | Disposition: A | Discharge status: Home / Self Care | Payer: Self-pay | Source: Ambulatory Visit

## 2013-02-20 NOTE — Patient Instructions (Addendum)
[name]  02/20/2013   Your procedure is scheduled on:  02/26/2013 Report to [facility] at  730 AM.  Call this number if you have problems the morning of surgery: [phone]   Remember:   Do not eat food or drink liquids after midnight.   Take these medicines the morning of surgery with A SIP OF WATER: lisinopril   Do not wear jewelry, make-up or nail polish.  Do not wear lotions, powders, or perfumes.  Do not shave 48 hours prior to surgery. Men may shave face and neck.  Do not bring valuables to the hospital.  Contacts, dentures or bridgework may not be worn into surgery.  Leave suitcase in the car. After surgery it may be brought to your room.  For patients admitted to the hospital, checkout time is 11:00 AM the day of discharge.   Patients discharged the day of surgery will not be allowed to drive  home.  Name and phone number of your driver: family  Special Instructions: Shower using CHG 2 nights before surgery and the night before surgery.  If you shower the day of surgery use CHG.  Use special wash - you have one bottle of CHG for all showers.  You should use approximately 1/3 of the bottle for each shower.   Please read over the following fact sheets that you were given: Pain Booklet, Coughing and Deep Breathing, MRSA Information, Surgical Site Infection Prevention, Anesthesia Post-op Instructions and Care and Recovery After Surgery Cystoscopy (Bladder Exam) A cystoscopy is an examination of your urinary bladder with a cystoscope. A cystoscope is an instrument like a small telescope with strong lights and lenses. It is inserted into the bladder through the urethra (the opening into the bladder) and allows your caregiver to examine the inside of your bladder. The procedure causes little discomfort and can be done in a hospital or office. It is a diagnostic procedure to evaluate the inside of your bladder. It may involve x-rays to further evaluate the ureters or internal  aspects of the kidneys. It may aid in the removal of urinary stones  or in taking tissue samples (biopsies) if necessary. The procedure is easier in females because of a shorter urethra. In a male, the procedure must be done through the penis. This often requires more sedation and more time to do the procedure. The procedure usually takes twenty minutes to one half hour for a male and approximately an hour for a male. LET YOUR CAREGIVERS KNOW ABOUT:  Allergies.  Medications taken including herbs, eye drops, over the counter medications, and creams.  Use of steroids (by mouth or creams).  Previous problems with anesthetics or novocaine.  Possibility of pregnancy, if this applies.  History of blood clots (thrombophlebitis).  History of bleeding or blood problems.  Previous surgery, especially where prosthetics have been used like hip or knee replacements, and heart valve replacements.  Other health problems. BEFORE THE PROCEDURE  You should be present 60 minutes prior to your procedure or as directed.  PROCEDURE During the procedure, you will:  Be assisted by your urologist and a nurse.  Lie on a cystoscopy table with your knees elevated and legs apart and covered with a drape. For women this is the same position as when a pap smear is taken.  Have the urethral area or penis washed and covered with sterile towels.  Have an anesthetic (numbing) jelly applied to the urethra. This is usually all that is  required for females but males may also require sedation.  Have the cystoscope inserted through the urethra and into the bladder. Sterile fluid will flow through the cystoscope and into the bladder. This will expand the bladder and provide clear fluid for the urologist to look through and examine the interior of the bladder.  Be allowed to go home once you are doing well, are stable, and awake if you were given a sedative. If given a sedative, have someone give you a ride home. AFTER  THE PROCEDURE  You may have temporary bleeding and burning on urination.  Drink lots of fluids. SEEK IMMEDIATE MEDICAL CARE IF:  There is an increase in blood in the urine or if you are passing clots.  You have difficulty in passing your urine  You develop chills and/or an unexplained oral temperature above 102 F (38.9 C). Your caregiver will discuss your results with you following the procedure. This may be at a later time if you have been sedated. If other testing or biopsies were taken, ask your caregiver how you are to obtain the results. Remember it is your responsibility to get your results. Do not assume everything is normal if you do not hear from your caregiver. Document Released: 10/07/2000 Document Revised: 01/02/2012 Document Reviewed: 04/02/2012 [facility] Patient Information 2013 [place], [place]. PATIENT INSTRUCTIONS POST-ANESTHESIA  IMMEDIATELY FOLLOWING SURGERY:  Do not drive or operate machinery for the first twenty four hours after surgery.  Do not make any important decisions for twenty four hours after surgery or while taking narcotic pain medications or sedatives.  If you develop intractable nausea and vomiting or a severe headache please notify your doctor immediately.  FOLLOW-UP:  Please make an appointment with your surgeon as instructed. You do not need to follow up with anesthesia unless specifically instructed to do so.  WOUND CARE INSTRUCTIONS (if applicable):  Keep a dry clean dressing on the anesthesia/puncture wound site if there is drainage.  Once the wound has quit draining you may leave it open to air.  Generally you should leave the bandage intact for twenty four hours unless there is drainage.  If the epidural site drains for more than 36-48 hours please call the anesthesia department.  QUESTIONS?:  Please feel free to call your physician or the hospital operator if you have any questions, and they will be happy to assist you.

## 2013-02-21 ENCOUNTER — Encounter (HOSPITAL_COMMUNITY): Payer: Self-pay

## 2013-02-21 ENCOUNTER — Encounter (HOSPITAL_COMMUNITY)
Admission: RE | Admit: 2013-02-21 | Discharge: 2013-02-21 | Disposition: A | Discharge status: Home / Self Care | Payer: Self-pay | Source: Ambulatory Visit | Attending: Urology | Admitting: Urology

## 2013-02-21 LAB — SURGICAL PCR SCREEN: MRSA, PCR: NEGATIVE

## 2013-02-21 LAB — BASIC METABOLIC PANEL
CO2: 27 mEq/L (ref 19–32)
Calcium: 10 mg/dL (ref 8.4–10.5)
Glucose, Bld: 109 mg/dL — ABNORMAL HIGH (ref 70–99)
Sodium: 140 mEq/L (ref 135–145)

## 2013-02-26 ENCOUNTER — Encounter (HOSPITAL_COMMUNITY)
Admission: RE | Disposition: A | Discharge status: Home / Self Care | Payer: Self-pay | Source: Ambulatory Visit | Attending: Urology

## 2013-02-26 ENCOUNTER — Ambulatory Visit (HOSPITAL_COMMUNITY)
Admission: RE | Admit: 2013-02-26 | Discharge: 2013-02-26 | Disposition: A | Discharge status: Home / Self Care | Payer: Self-pay | Source: Ambulatory Visit | Attending: Urology | Admitting: Urology

## 2013-02-26 ENCOUNTER — Encounter (HOSPITAL_COMMUNITY): Payer: Self-pay | Admitting: *Deleted

## 2013-02-26 ENCOUNTER — Ambulatory Visit (HOSPITAL_COMMUNITY): Payer: Self-pay | Admitting: Anesthesiology

## 2013-02-26 ENCOUNTER — Ambulatory Visit (HOSPITAL_COMMUNITY): Payer: Self-pay

## 2013-02-26 ENCOUNTER — Encounter (HOSPITAL_COMMUNITY): Payer: Self-pay | Admitting: Anesthesiology

## 2013-02-26 DIAGNOSIS — N1330 Unspecified hydronephrosis: Secondary | ICD-10-CM | POA: Insufficient documentation

## 2013-02-26 DIAGNOSIS — E538 Deficiency of other specified B group vitamins: Secondary | ICD-10-CM | POA: Insufficient documentation

## 2013-02-26 DIAGNOSIS — Z87442 Personal history of urinary calculi: Secondary | ICD-10-CM | POA: Insufficient documentation

## 2013-02-26 DIAGNOSIS — N189 Chronic kidney disease, unspecified: Secondary | ICD-10-CM | POA: Insufficient documentation

## 2013-02-26 DIAGNOSIS — I129 Hypertensive chronic kidney disease with stage 1 through stage 4 chronic kidney disease, or unspecified chronic kidney disease: Secondary | ICD-10-CM | POA: Insufficient documentation

## 2013-02-26 DIAGNOSIS — N200 Calculus of kidney: Secondary | ICD-10-CM | POA: Insufficient documentation

## 2013-02-26 DIAGNOSIS — N390 Urinary tract infection, site not specified: Secondary | ICD-10-CM | POA: Insufficient documentation

## 2013-02-26 HISTORY — PX: CYSTOSCOPY W/ URETERAL STENT REMOVAL: SHX1430

## 2013-02-26 HISTORY — PX: HOLMIUM LASER APPLICATION: SHX5852

## 2013-02-26 HISTORY — PX: CYSTOSCOPY/RETROGRADE/URETEROSCOPY/STONE EXTRACTION WITH BASKET: SHX5317

## 2013-02-26 HISTORY — PX: CYSTOSCOPY WITH BIOPSY: SHX5122

## 2013-02-26 SURGERY — REMOVAL, STENT, URETER, CYSTOSCOPIC
Anesthesia: Spinal | Site: Ureter | Laterality: Right | Wound class: Clean Contaminated

## 2013-02-26 MED ORDER — LIDOCAINE HCL (PF) 1 % IJ SOLN
INTRAMUSCULAR | Status: AC
  Filled 2013-02-26: qty 5

## 2013-02-26 MED ORDER — NALOXONE HCL 0.4 MG/ML IJ SOLN
INTRAMUSCULAR | Status: DC | PRN
  Administered 2013-02-26: 80 ug via INTRAVENOUS

## 2013-02-26 MED ORDER — FENTANYL CITRATE 0.05 MG/ML IJ SOLN
INTRAMUSCULAR | Status: AC
  Filled 2013-02-26: qty 2

## 2013-02-26 MED ORDER — ROCURONIUM BROMIDE 50 MG/5ML IV SOLN
INTRAVENOUS | Status: AC
  Filled 2013-02-26: qty 1

## 2013-02-26 MED ORDER — GENTAMICIN IN SALINE 1.6-0.9 MG/ML-% IV SOLN: 80.0000 mg | Freq: Once | INTRAVENOUS | Status: DC

## 2013-02-26 MED ORDER — PROPOFOL INFUSION 10 MG/ML OPTIME
INTRAVENOUS | Status: DC | PRN
  Administered 2013-02-26 (×2): 75 ug/kg/min via INTRAVENOUS

## 2013-02-26 MED ORDER — LIDOCAINE HCL (CARDIAC) 10 MG/ML IV SOLN
INTRAVENOUS | Status: DC | PRN
  Administered 2013-02-26: 50 mg via INTRAVENOUS

## 2013-02-26 MED ORDER — FENTANYL CITRATE 0.05 MG/ML IJ SOLN: 25.0000 ug | INTRAMUSCULAR | Status: DC | PRN

## 2013-02-26 MED ORDER — ARTIFICIAL TEARS OP OINT
TOPICAL_OINTMENT | OPHTHALMIC | Status: AC
  Filled 2013-02-26: qty 3.5

## 2013-02-26 MED ORDER — IOHEXOL 350 MG/ML SOLN
INTRAVENOUS | Status: DC | PRN
  Administered 2013-02-26: 60 mL via INTRAVENOUS

## 2013-02-26 MED ORDER — FENTANYL CITRATE 0.05 MG/ML IJ SOLN
INTRAMUSCULAR | Status: DC | PRN
  Administered 2013-02-26: 25 ug via INTRAVENOUS
  Administered 2013-02-26 (×3): 50 ug via INTRAVENOUS
  Administered 2013-02-26: 100 ug via INTRAVENOUS
  Administered 2013-02-26: 25 ug via INTRAVENOUS
  Administered 2013-02-26: 100 ug via INTRAVENOUS
  Administered 2013-02-26 (×2): 50 ug via INTRAVENOUS

## 2013-02-26 MED ORDER — ONDANSETRON HCL 4 MG/2ML IJ SOLN: 4.0000 mg | Freq: Once | INTRAMUSCULAR | Status: DC | PRN

## 2013-02-26 MED ORDER — LIDOCAINE HCL (PF) 1 % IJ SOLN
INTRAMUSCULAR | Status: AC
  Filled 2013-02-26: qty 10

## 2013-02-26 MED ORDER — PROPOFOL 10 MG/ML IV EMUL
INTRAVENOUS | Status: AC
  Filled 2013-02-26: qty 20

## 2013-02-26 MED ORDER — CEFAZOLIN SODIUM-DEXTROSE 2-3 GM-% IV SOLR
INTRAVENOUS | Status: AC
  Filled 2013-02-26: qty 50

## 2013-02-26 MED ORDER — LACTATED RINGERS IV SOLN
INTRAVENOUS | Status: DC | PRN
  Administered 2013-02-26 (×3): via INTRAVENOUS
  Administered 2013-02-26: 1000 mL

## 2013-02-26 MED ORDER — BUPIVACAINE IN DEXTROSE 0.75-8.25 % IT SOLN
INTRATHECAL | Status: AC
  Filled 2013-02-26: qty 2

## 2013-02-26 MED ORDER — FENTANYL CITRATE 0.05 MG/ML IJ SOLN
25.0000 ug | INTRAMUSCULAR | Status: DC | PRN
  Administered 2013-02-26: 25 ug via INTRAVENOUS

## 2013-02-26 MED ORDER — CEFAZOLIN SODIUM-DEXTROSE 2-3 GM-% IV SOLR
INTRAVENOUS | Status: DC | PRN
  Administered 2013-02-26: 2 g via INTRAVENOUS

## 2013-02-26 MED ORDER — EPHEDRINE SULFATE 50 MG/ML IJ SOLN
INTRAMUSCULAR | Status: AC
  Filled 2013-02-26: qty 1

## 2013-02-26 MED ORDER — PROPOFOL 10 MG/ML IV EMUL
INTRAVENOUS | Status: AC
Start: 2013-02-26 — End: 2013-02-26
  Filled 2013-02-26: qty 40

## 2013-02-26 MED ORDER — GENTAMICIN IN SALINE 1.6-0.9 MG/ML-% IV SOLN
INTRAVENOUS | Status: AC
  Filled 2013-02-26: qty 50

## 2013-02-26 MED ORDER — GLYCOPYRROLATE 0.2 MG/ML IJ SOLN
INTRAMUSCULAR | Status: AC
  Filled 2013-02-26: qty 3

## 2013-02-26 MED ORDER — ROCURONIUM BROMIDE 100 MG/10ML IV SOLN
INTRAVENOUS | Status: DC | PRN
  Administered 2013-02-26: 10 mg via INTRAVENOUS
  Administered 2013-02-26: 20 mg via INTRAVENOUS

## 2013-02-26 MED ORDER — SODIUM CHLORIDE 0.9 % IR SOLN
Status: DC | PRN
  Administered 2013-02-26 (×3): 3000 mL via INTRAVESICAL

## 2013-02-26 MED ORDER — MIDAZOLAM HCL 2 MG/2ML IJ SOLN
1.0000 mg | INTRAMUSCULAR | Status: DC | PRN
  Administered 2013-02-26: 2 mg via INTRAVENOUS

## 2013-02-26 MED ORDER — NALOXONE HCL 0.4 MG/ML IJ SOLN
INTRAMUSCULAR | Status: AC
  Filled 2013-02-26: qty 1

## 2013-02-26 MED ORDER — GLYCOPYRROLATE 0.2 MG/ML IJ SOLN
INTRAMUSCULAR | Status: DC | PRN
  Administered 2013-02-26: 0.6 mg via INTRAVENOUS

## 2013-02-26 MED ORDER — LACTATED RINGERS IV SOLN: INTRAVENOUS | Status: DC

## 2013-02-26 MED ORDER — CEFAZOLIN SODIUM-DEXTROSE 2-3 GM-% IV SOLR: 2.0000 g | Freq: Once | INTRAVENOUS | Status: DC

## 2013-02-26 MED ORDER — OXYCODONE-ACETAMINOPHEN 7.5-325 MG PO TABS: 1.0000 | ORAL_TABLET | Freq: Four times a day (QID) | ORAL | Status: DC | PRN

## 2013-02-26 MED ORDER — MIDAZOLAM HCL 2 MG/2ML IJ SOLN
INTRAMUSCULAR | Status: AC
  Filled 2013-02-26: qty 2

## 2013-02-26 MED ORDER — STERILE WATER FOR IRRIGATION IR SOLN
Status: DC | PRN
  Administered 2013-02-26: 1000 mL

## 2013-02-26 MED ORDER — NEOSTIGMINE METHYLSULFATE 1 MG/ML IJ SOLN
INTRAMUSCULAR | Status: DC | PRN
  Administered 2013-02-26: 4 mg via INTRAVENOUS

## 2013-02-26 MED ORDER — SUCCINYLCHOLINE CHLORIDE 20 MG/ML IJ SOLN
INTRAMUSCULAR | Status: DC | PRN
  Administered 2013-02-26: 180 mg via INTRAVENOUS

## 2013-02-26 MED ORDER — SUCCINYLCHOLINE CHLORIDE 20 MG/ML IJ SOLN
INTRAMUSCULAR | Status: AC
  Filled 2013-02-26: qty 1

## 2013-02-26 MED ORDER — PROPOFOL 10 MG/ML IV BOLUS
INTRAVENOUS | Status: DC | PRN
  Administered 2013-02-26: 50 mg via INTRAVENOUS
  Administered 2013-02-26: 160 mg via INTRAVENOUS

## 2013-02-26 MED ORDER — NEOSTIGMINE METHYLSULFATE 1 MG/ML IJ SOLN
INTRAMUSCULAR | Status: AC
  Filled 2013-02-26: qty 1

## 2013-02-26 SURGICAL SUPPLY — 29 items
BAG DRAIN URO TABLE W/ADPT NS (DRAPE) ×4 IMPLANT
BAG DRN 8 ADPR NS SKTRN CSTL (DRAPE) ×3
BASKET LASER NITINOL 1.9FR (BASKET) ×1 IMPLANT
BSKT STON RTRVL 120 1.9FR (BASKET) ×3
CATH 5 FR WEDGE TIP (UROLOGICAL SUPPLIES) ×4 IMPLANT
CATH OPEN TIP 5FR (CATHETERS) ×4 IMPLANT
CATH ROBINSON RED A/P 14FR (CATHETERS) ×1 IMPLANT
CLOTH BEACON ORANGE TIMEOUT ST (SAFETY) ×4 IMPLANT
DILATOR UROMAX ULTRA (MISCELLANEOUS) ×1 IMPLANT
FORCEPS BIOP PIRANHA Y (CUTTING FORCEPS) ×1 IMPLANT
GLOVE BIO SURGEON STRL SZ7 (GLOVE) ×4 IMPLANT
GLOVE BIOGEL PI IND STRL 6.5 (GLOVE) IMPLANT
GLOVE BIOGEL PI IND STRL 7.0 (GLOVE) IMPLANT
GLOVE BIOGEL PI INDICATOR 6.5 (GLOVE) ×1
GLOVE BIOGEL PI INDICATOR 7.0 (GLOVE) ×1
GLOVE EXAM NITRILE MD LF STRL (GLOVE) ×1 IMPLANT
GOWN STRL REIN XL XLG (GOWN DISPOSABLE) ×4 IMPLANT
IV NS IRRIG 3000ML ARTHROMATIC (IV SOLUTION) ×9 IMPLANT
KIT ROOM TURNOVER AP CYSTO (KITS) ×4 IMPLANT
LASER FIBER DISP (UROLOGICAL SUPPLIES) ×1 IMPLANT
LUBRICANT JELLY 4.5OZ STERILE (MISCELLANEOUS) ×1 IMPLANT
MANIFOLD NEPTUNE II (INSTRUMENTS) ×4 IMPLANT
PACK CYSTO (CUSTOM PROCEDURE TRAY) ×4 IMPLANT
PAD ARMBOARD 7.5X6 YLW CONV (MISCELLANEOUS) ×4 IMPLANT
PAD TELFA 3X4 1S STER (GAUZE/BANDAGES/DRESSINGS) ×1 IMPLANT
SHEATH URET ACCESS 12FR/55CM (UROLOGICAL SUPPLIES) ×1 IMPLANT
STENT PERCUFLEX 4.8FRX24 (STENTS) ×2 IMPLANT
TOWEL OR 17X26 4PK STRL BLUE (TOWEL DISPOSABLE) ×4 IMPLANT
WIRE GUIDE BENTSON .035 15CM (WIRE) ×5 IMPLANT

## 2013-02-26 NOTE — Transfer of Care (Signed)
Immediate Anesthesia Transfer of Care Note  Patient: [name]  Procedure(s) Performed: Procedure(s): CYSTOSCOPY WITH BILATERAL URETERAL  STENT REMOVAL (Bilateral) HOLMIUM LASER LITHOTRIPSY OF RIGHT RENAL CALCULUS; RIGHT URETEROSCOPIC STONE EXTRACTION WITH BASKET (Right) CYSTOSCOPY; BILATERAL RETROGRADE PYELOGRAM; BILATERAL URETEROSCOPY; BILATERAL URETERAL STENT PLACEMENT (Bilateral) CYSTOSCOPY WITH LEFT URETERAL BIOPSY (Left)  Patient Location: PACU  Anesthesia Type:General  Level of Consciousness: sedated and patient cooperative  Airway & Oxygen Therapy: Patient Spontanous Breathing and Patient connected to face mask oxygen  Post-op Assessment: Report given to PACU RN and Post -op Vital signs reviewed and stable  Post vital signs: Reviewed and stable  Complications: No apparent anesthesia complications

## 2013-02-26 NOTE — Anesthesia Preprocedure Evaluation (Addendum)
Anesthesia Evaluation  Patient identified by MRN, date of birth, ID band Patient awake    Reviewed: Allergy & Precautions, H&P , NPO status , Patient's Chart, lab work & pertinent test results  History of Anesthesia Complications Negative for: history of anesthetic complications  Airway Mallampati: II TM Distance: >3 FB Neck ROM: Full    Dental  (+) Teeth Intact, Partial Lower and Partial Upper   Pulmonary neg pulmonary ROS,  breath sounds clear to auscultation        Cardiovascular hypertension, Pt. on medications Rhythm:Regular Rate:Normal     Neuro/Psych    GI/Hepatic   Endo/Other    Renal/GU Renal disease (obstructive uropathy, stents)     Musculoskeletal   Abdominal   Peds  Hematology  (+) Blood dyscrasia (B12 def), anemia ,   Anesthesia Other Findings   Reproductive/Obstetrics                          Anesthesia Physical Anesthesia Plan  ASA: II  Anesthesia Plan: Spinal   Post-op Pain Management:    Induction:   Airway Management Planned: Nasal Cannula  Additional Equipment:   Intra-op Plan:   Post-operative Plan:   Informed Consent: I have reviewed the patients History and Physical, chart, labs and discussed the procedure including the risks, benefits and alternatives for the proposed anesthesia with the patient or authorized representative who has indicated his/her understanding and acceptance.     Plan Discussed with:   Anesthesia Plan Comments:         Anesthesia Quick Evaluation

## 2013-02-26 NOTE — Preoperative (Signed)
Beta Blockers   Reason not to administer Beta Blockers:Not Applicable 

## 2013-02-26 NOTE — Anesthesia Procedure Notes (Signed)
Procedure Name: Intubation Date/Time: 02/26/2013 8:28 AM Performed by: Carolyne Littles, AMY L Pre-anesthesia Checklist: Patient identified, Timeout performed, Emergency Drugs available, Suction available and Patient being monitored Patient Re-evaluated:Patient Re-evaluated prior to inductionOxygen Delivery Method: Circle system utilized Preoxygenation: Pre-oxygenation with 100% oxygen Intubation Type: IV induction, Rapid sequence and Cricoid Pressure applied Ventilation: Mask ventilation without difficulty Tube size: 7.0 mm Number of attempts: 2 Airway Equipment and Method: Video-laryngoscopy Placement Confirmation: ETT inserted through vocal cords under direct vision,  positive ETCO2 and breath sounds checked- equal and bilateral Secured at: 22 cm Dental Injury: Teeth and Oropharynx as per pre-operative assessment  Difficulty Due To: Difficulty was unanticipated Comments: Attempt intubation x1 with Hyacinth Meeker 3 by Carolyne Littles, unable to visualize cords; Glidescope used by Dr. Marcos Eke to intubate on 1 attempt without difficulty

## 2013-02-26 NOTE — Progress Notes (Signed)
No change in H&P on reexamination. 

## 2013-02-26 NOTE — Anesthesia Postprocedure Evaluation (Signed)
  Anesthesia Post-op Note  Patient: [name]  Procedure(s) Performed: Procedure(s): CYSTOSCOPY WITH BILATERAL URETERAL  STENT REMOVAL (Bilateral) HOLMIUM LASER LITHOTRIPSY OF RIGHT RENAL CALCULUS; RIGHT URETEROSCOPIC STONE EXTRACTION WITH BASKET (Right) CYSTOSCOPY; BILATERAL RETROGRADE PYELOGRAM; BILATERAL URETEROSCOPY; BILATERAL URETERAL STENT PLACEMENT (Bilateral) CYSTOSCOPY WITH LEFT URETERAL BIOPSY (Left)  Patient Location: PACU  Anesthesia Type:General  Level of Consciousness: sedated and patient cooperative  Airway and Oxygen Therapy: Patient Spontanous Breathing and Patient connected to face mask oxygen  Post-op Pain: none  Post-op Assessment: Post-op Vital signs reviewed, Patient's Cardiovascular Status Stable, Respiratory Function Stable, Patent Airway and No signs of Nausea or vomiting  Post-op Vital Signs: Reviewed and stable  Complications: No apparent anesthesia complications

## 2013-02-26 NOTE — H&P (Signed)
NAMEMANVIR, PRABHU             ACCOUNT NO.:  [id number]  MEDICAL RECORD NO.:  [id number]  LOCATION:  [facility]                         FACILITY:  [facility]  PHYSICIAN:  Ky Barban, M.D.DATE OF BIRTH:  [birth year]  DATE OF ADMISSION:  02/26/2013 DATE OF DISCHARGE:  LH                             HISTORY & PHYSICAL   CHIEF COMPLAINT:  Bilateral renal calculi.  HISTORY:  A 62 year old gentleman, who is known to me.  He was in the hospital, [facility] in April last year with renal failure.  He was found to have bilateral renal calculi with hydronephrosis.  So, I put bilateral ureteral stents and sent him home.  He is being followed up by Dr. Kristian Covey, nephrologist and I have seen that slowly his BUN and creatinine has come down, but still it is not completely normal.  His creatinine is down to 2.1.  BUN is 21.  So he has bilateral ureteral stents.  So, my plan is that I am going to take the right ureteral stent out and with the ureteroscope, I can see if I can find the stone and break it and get it out.  I have told the patient and his wife in detail, procedure, limitation and complications, no guarantees about the results.  The stones are radiolucent, so I cannot do lithotripsy. Procedures, complication especially ureteral perforation, urinary tract infection, and it can be very seriously leading to death.  I have offered them I can send them to [facility] but they say that I should go ahead and do it here.  They do not want to go there.  So, he is coming as outpatient.  We will try to do ureteroscopic stone basket on the right side.  I am not going on both sides at the same time.  I will first do the right side and see how things go.  As per the past medical history, he was here on February 07, 2012, complaining of nausea, vomiting, not feeling well.  He was found to have bilateral ureteral calculi.  A month ago he was in the emergency room with left renal colic.  He was found to have 10  mm stone in the distal left ureter.  Subsequently when he came on this visit on April 16th, CT scan does not show any distal right ureteral calculus.  He probably passed it, had bilateral hydronephrosis, atrophic right kidney, so he was admitted and subsequently I have inserted double-J stents on both sides.  Creatinine has come down, not completely normal.  He also has hypertension.  His followup CT which was just done last month shows there is no hydronephrosis.  Bilateral renal calculi with bilateral ureteral stents. I did not want to take out the stents alone because the stones might cause obstruction and he has very little functioning renal tissue left on the right side.  So I advised them let me see if I can get the stone out.  He has hypertension and B12 deficiency and history of having kidney stones in the past.  He told me that several years ago I have done lithotripsy in [facility], but I have not seen him since. He never had any  open surgery.  PERSONAL HISTORY:  He does not smoke or drink.  REVIEW OF SYSTEMS:  No chest pain, orthopnea, PND, nausea, vomiting.  PHYSICAL EXAMINATION:  GENERAL:  Moderately built male, fully conscious, alert, oriented.  Blood pressure 120/88, temperature is normal. CENTRAL NERVOUS SYSTEM:  No gross neurological deficit. HEAD, NECK, EYES, ENT:  Negative. CHEST:  Symmetrical.  Normal breath sounds. HEART:  Regular sinus rhythm.  No murmur. ABDOMEN:  Soft, flat.  Liver, spleen, kidneys not palpable.  No CVA tenderness. GU:  External genitalia is circumcised.  Meatus adequate.  Testicles are normal. RECTAL:  No rectal mass.  Prostate 1.5+, smooth, and firm.  IMPRESSION:  Bilateral renal calculi, bilateral ureteral stents.  PLAN:  Cystoscopy, right retrograde pyelogram, ureteroscopic stone basket attempt on the right side under anesthesia as outpatient.     Ky Barban, M.D.     MIJ/MEDQ  D:  02/25/2013  T:  02/26/2013  Job:   [id number]

## 2013-02-26 NOTE — Addendum Note (Signed)
Addendum created 02/26/13 1228 by Marolyn Hammock, CRNA   Modules edited: SmartForms   SmartForms:  VN Section for SmartForms 146

## 2013-02-26 NOTE — Brief Op Note (Signed)
02/26/2013  10:49 AM  PATIENT:  [name]  62 y.o. male  PRE-OPERATIVE DIAGNOSIS:  Bilateral Renal Calculus; Urinary Tract Infection  POST-OPERATIVE DIAGNOSIS:  Bilateral Renal Calculus; Urinary Tract Infection  PROCEDURE:  Procedure(s): CYSTOSCOPY WITH BILATERAL URETERAL  STENT REMOVAL (Bilateral) HOLMIUM LASER LITHOTRIPSY OF RIGHT RENAL CALCULUS; RIGHT URETEROSCOPIC STONE EXTRACTION WITH BASKET (Right) CYSTOSCOPY; BILATERAL RETROGRADE PYELOGRAM; BILATERAL URETEROSCOPY; BILATERAL URETERAL STENT PLACEMENT (Bilateral) CYSTOSCOPY WITH LEFT URETERAL BIOPSY (Left)  SURGEON:  Surgeon(s) and Role:    * Ky Barban, MD - Primary  PHYSICIAN ASSISTANT:   ASSISTANTS: none   ANESTHESIA:   general  EBL:  Total I/O In: 1700 [I.V.:1700] Out: -   BLOOD ADMINISTERED:none  DRAINS: bilteral ureteral stents   LOCAL MEDICATIONS USED:  NONE  SPECIMEN:  Source of Specimen:  bipsy l ureter in uvj  DISPOSITION OF SPECIMEN:  PATHOLOGY  COUNTS:  YES  TOURNIQUET:  * No tourniquets in log *  DICTATION: .Other Dictation: Dictation Number dictation [id number]  PLAN OF CARE: Discharge to home after PACU  PATIENT DISPOSITION:  PACU - hemodynamically stable.   Delay start of Pharmacological VTE agent (>24hrs) due to surgical blood loss or risk of bleeding:

## 2013-02-27 NOTE — Op Note (Signed)
[name], [name]             ACCOUNT NO.:  [id number]  MEDICAL RECORD NO.:  [id number]  LOCATION:  [facility]                          FACILITY:  [facility]  PHYSICIAN:  Ky Barban, M.D.DATE OF BIRTH:  [birth year]  DATE OF PROCEDURE: DATE OF DISCHARGE:  02/26/2013                              OPERATIVE REPORT   PREOPERATIVE DIAGNOSES: 1. Bilateral renal calculi, right renal calculus, Holmium laser     lithotripsy, ureteroscopic stone basket extraction partial and     insertion of left double-J stent, dilation of right upper ureter     with balloon dilator. 2. Left ureteral re-endoscopy. 3. Left retrograde pyelogram.  POSTOPERATIVE DIAGNOSES: 1. Bilateral renal calculi, right renal calculus, Holmium laser     lithotripsy, ureteroscopic stone basket extraction partial and     insertion of left double-J stent, dilation of right upper ureter     with balloon dilator. 2. Left ureteral re-endoscopy. 3. Left retrograde pyelogram.  Bilateral retrograde pyelogram we have done.  ANESTHESIA:  General endotracheal.  PROCEDURE:  The patient under general endotracheal anesthesia in lithotomy position.  After usual prep and drape, #25 cystoscope introduced into the bladder.  Right ureteral stent was visualized.  It was removed with the help of a flexible forceps.  Then right ureteral orifice was catheterized with a wedge catheter.  Hypaque injected under fluoroscopic control.  The dye goes up into the upper ureter.  Near the ureteropelvic junction, there is a filling defect.  I suspected that that is the stone but subsequently on ureteroscopy, I did not see any stone at that point.  There is a definite stone in the lower pole of calix on the right side.  After introducing the guidewire, a 55 cm ureteral access tube was introduced on the right side.  Through the tube, I introduced flexible ureteroscope, went into the renal pelvis which is inspected, looks fine.  The lower pole calix, I  can see the stone and lot of debris in it.  Then with the help of holmium laser, I was able to break the stone.  Part of the stone was extracted with the help of Nitinol basket.  There is still a piece of stone there.  I tried to break it further, but I could not find it again.  So at this point, I decided to get out.  A double-J stent was positioned between the renal pelvis and the bladder on the right side.  Now I removed the left double- J stent with the help of a forceps and dye was injected into the left ureter.  The left ureter was markedly dilated to the ureterovesical junction, but I do not see any filling defect in that area.  Neither I could see any filling defect in the renal pelvis, which is dilated along with the calices which are clubbed.  I do not know why he has a double-J stent on that side for several months about the ureter and the renal pelvis was still dilated.  So I decided to look into the left renal pelvis and the left ureterovesical junction and with the help of a short rigid ureteroscope, after dilating the intramural ureter, it was introduced into the left  ureter and I do not see any stone in the left ureterovesical junction and there was polyp like pathology in the ureterovesical junction.  It was biopsied with the help of a flexible forceps through the ureteroscope.  It was completely removed and sent for biopsy purposes.  The rest of the ureter is dilated, but I do not see any pathology in the ureter except some stone debris floating around.  I went into the upper ureter with the help of rigid scope.  I cannot go beyond that.  So I decided to do flexible ureteroscopy.  So guidewire was passed up into the renal pelvis and over the guidewire, I introduced flexible ureteroscope and the renal pelvis, although it is dilated but I could not find that stone on this side.  After considerable time to account for the stone, decided to get out and double-J stent over  the guidewire was introduced after removing the ureteroscope.  It was positioned between the renal pelvis and the bladder.  I want to also understand that stone was visible on CT scan, but it is a radiolucent stone and today when I did a retrograde pyelogram, I could not see any filling defect and I wonder if the stone is there or not, maybe there is no stone, why his ureter is dilated.  I do not know yet and I have taken this biopsy.  Let us see if it shows anything.  All the instruments were removed.  The bladder was drained. The patient left the operating room in satisfactory condition.     Ky Barban, M.D.     MIJ/MEDQ  D:  02/26/2013  T:  02/26/2013  Job:  [id number]

## 2013-03-15 NOTE — Discharge Summary (Signed)
Note [phone]

## 2013-03-15 NOTE — Discharge Summary (Signed)
Note #[id number]

## 2013-03-15 NOTE — Discharge Summary (Signed)
NAMEGLENNON, KOPKO             ACCOUNT NO.:  [id number]  MEDICAL RECORD NO.:  [id number]  LOCATION:  [facility]                          FACILITY:  [facility]  PHYSICIAN:  Ky Barban, M.D.DATE OF BIRTH:  [birth year]  DATE OF ADMISSION:  02/26/2013 DATE OF DISCHARGE:  05/06/2014LH                              DISCHARGE SUMMARY   This patient who is 62 year old, who is known to me.  He has bilateral ureteral stents since he had ureteroscopic stone basket attempt few months ago.  He has bilateral stones causing obstruction.  During this, he had a CT scan done, which shows there is a stone in the right kidney with a double-J stent, and also another large stone in the left kidney with a double-J stent.  He was brought and so I am going to go after the right renal calculus and see if I can remove it ureteroscopically.  He was taken to the operating room.  Attempt was made.  The stone was partially broken, part of the stone was retrieved.  I left a double-J stent.  Then, I did a ureterorenoscope on the left side.  I did not see any stone in the left kidney.  He has markedly dilated ureter.  He has obstruction at the left ureterovesical junction.  Etiology unknown either there was a lesion.  A small papillary lesion in that area which I had biopsied to make sure there is no cancer in that area, but his ureter is markedly dilated.  There is no stone in the kidney or in the ureters.  I removed the double-J stent and put a new double-J stent in. Left a Foley catheter in and sent the patient for overnight stay here in the hospital.  Next day, he is doing better.  His creatinine is down to 2.1.  BUN is 21.  Then, he came to the office.  I plan to remove his double-J stent from the right side and also maybe left side and then follow his BUN and creatinine, and also do another CT to see the stone in the right kidney comes out, it stays there.  FINAL DISCHARGE DIAGNOSES:  Right renal calculus, left  renal failure, no stone on the left side, bilateral ureteral stents, markedly dilated left ureter.  He is being discharged home.  We will be followed up by me in the office.  I will see him back in the office in 1 week.     Ky Barban, M.D.     MIJ/MEDQ  D:  03/15/2013  T:  03/15/2013  Job:  [id number]

## 2013-04-03 ENCOUNTER — Other Ambulatory Visit: Payer: Self-pay

## 2013-04-03 ENCOUNTER — Emergency Department (HOSPITAL_COMMUNITY)
Admission: EM | Admit: 2013-04-03 | Discharge: 2013-04-03 | Disposition: A | Discharge status: Home / Self Care | Payer: Self-pay | Attending: Emergency Medicine | Admitting: Emergency Medicine

## 2013-04-03 ENCOUNTER — Ambulatory Visit (HOSPITAL_COMMUNITY)
Admission: RE | Admit: 2013-04-03 | Discharge: 2013-04-03 | Disposition: A | Discharge status: Home / Self Care | Payer: Self-pay | Source: Ambulatory Visit | Attending: Emergency Medicine | Admitting: Emergency Medicine

## 2013-04-03 DIAGNOSIS — I10 Essential (primary) hypertension: Secondary | ICD-10-CM | POA: Insufficient documentation

## 2013-04-03 DIAGNOSIS — Z862 Personal history of diseases of the blood and blood-forming organs and certain disorders involving the immune mechanism: Secondary | ICD-10-CM | POA: Insufficient documentation

## 2013-04-03 DIAGNOSIS — N289 Disorder of kidney and ureter, unspecified: Secondary | ICD-10-CM | POA: Insufficient documentation

## 2013-04-03 DIAGNOSIS — Z79899 Other long term (current) drug therapy: Secondary | ICD-10-CM | POA: Insufficient documentation

## 2013-04-03 DIAGNOSIS — Z8639 Personal history of other endocrine, nutritional and metabolic disease: Secondary | ICD-10-CM | POA: Insufficient documentation

## 2013-04-03 DIAGNOSIS — R079 Chest pain, unspecified: Secondary | ICD-10-CM | POA: Insufficient documentation

## 2013-04-03 DIAGNOSIS — Z87448 Personal history of other diseases of urinary system: Secondary | ICD-10-CM | POA: Insufficient documentation

## 2013-04-03 DIAGNOSIS — Z8719 Personal history of other diseases of the digestive system: Secondary | ICD-10-CM | POA: Insufficient documentation

## 2013-04-03 LAB — CBC WITH DIFFERENTIAL/PLATELET
Basophils Absolute: 0 10*3/uL (ref 0.0–0.1)
Basophils Relative: 0 % (ref 0–1)
Hemoglobin: 12.1 g/dL — ABNORMAL LOW (ref 13.0–17.0)
Lymphocytes Relative: 42 % (ref 12–46)
MCHC: 32.8 g/dL (ref 30.0–36.0)
Neutro Abs: 3.1 10*3/uL (ref 1.7–7.7)
Neutrophils Relative %: 44 % (ref 43–77)
RDW: 15.7 % — ABNORMAL HIGH (ref 11.5–15.5)
WBC: 6.9 10*3/uL (ref 4.0–10.5)

## 2013-04-03 LAB — BASIC METABOLIC PANEL
BUN: 19 mg/dL (ref 6–23)
Creatinine, Ser: 2.75 mg/dL — ABNORMAL HIGH (ref 0.50–1.35)
GFR calc Af Amer: 27 mL/min — ABNORMAL LOW (ref >90)
GFR calc non Af Amer: 23 mL/min — ABNORMAL LOW (ref >90)
Potassium: 4.2 mEq/L (ref 3.5–5.1)

## 2013-04-09 ENCOUNTER — Other Ambulatory Visit (HOSPITAL_COMMUNITY): Payer: Self-pay | Admitting: Urology

## 2013-04-09 DIAGNOSIS — N200 Calculus of kidney: Secondary | ICD-10-CM

## 2013-04-11 ENCOUNTER — Ambulatory Visit (HOSPITAL_COMMUNITY)
Admission: RE | Admit: 2013-04-11 | Discharge: 2013-04-11 | Disposition: A | Discharge status: Home / Self Care | Payer: Self-pay | Source: Ambulatory Visit | Attending: Urology | Admitting: Urology

## 2013-04-11 DIAGNOSIS — Z09 Encounter for follow-up examination after completed treatment for conditions other than malignant neoplasm: Secondary | ICD-10-CM | POA: Insufficient documentation

## 2013-04-11 DIAGNOSIS — N200 Calculus of kidney: Secondary | ICD-10-CM | POA: Insufficient documentation

## 2013-05-05 IMAGING — RF DG RETROGRADE PYELOGRAM
1 series · 15 of 16 positions shown · non-contrast
Comparison: None.

CLINICAL DATA: Bilateral stent placement and stone removal.

RETROGRADE PYELOGRAM

[Series 1: run · 8 acquisitions, 15 frames shown]
[im 1/8]
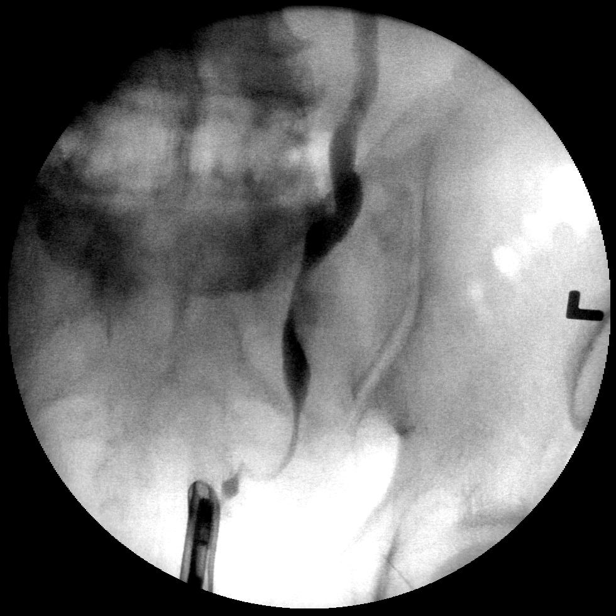
[im 1/8]
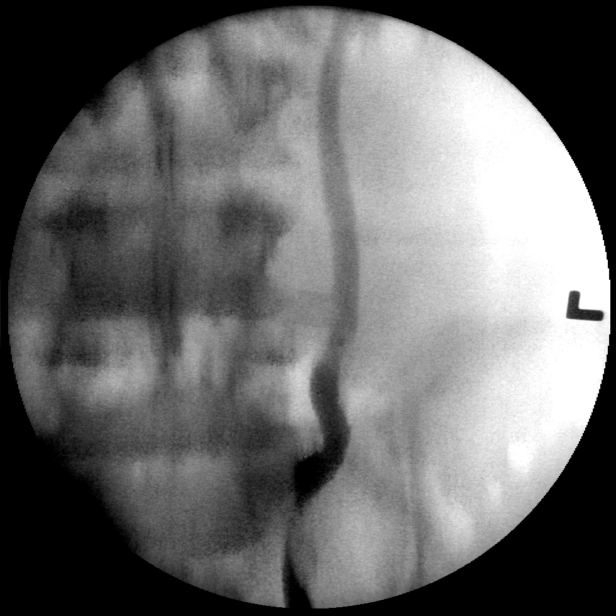
[im 1/8]
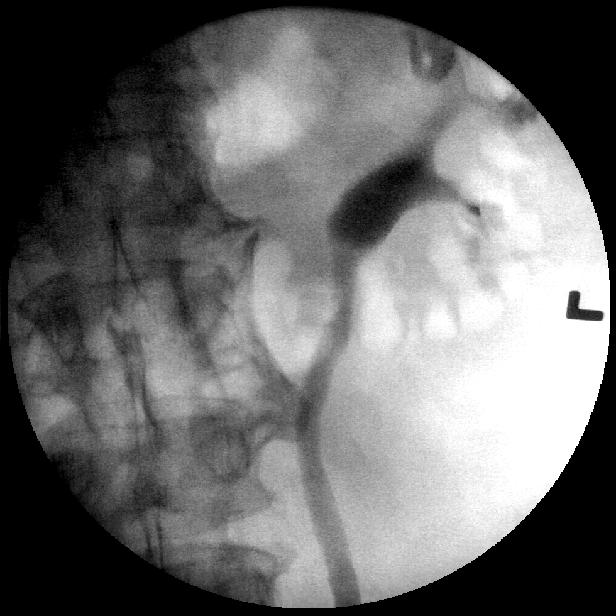
[im 1/8]
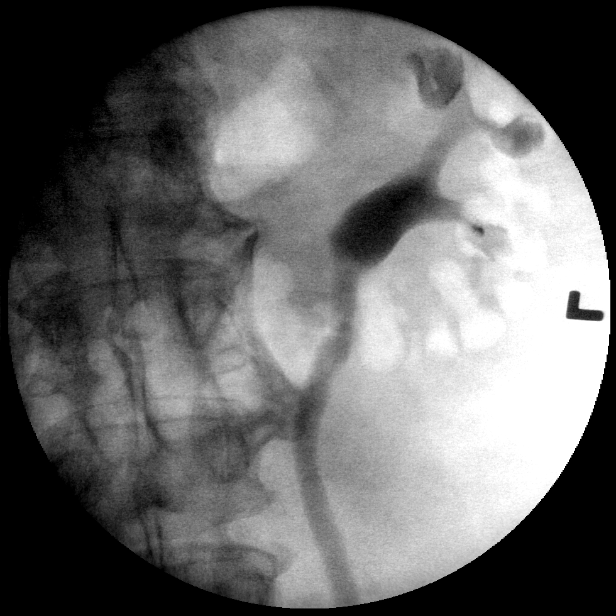
[im 2/8]
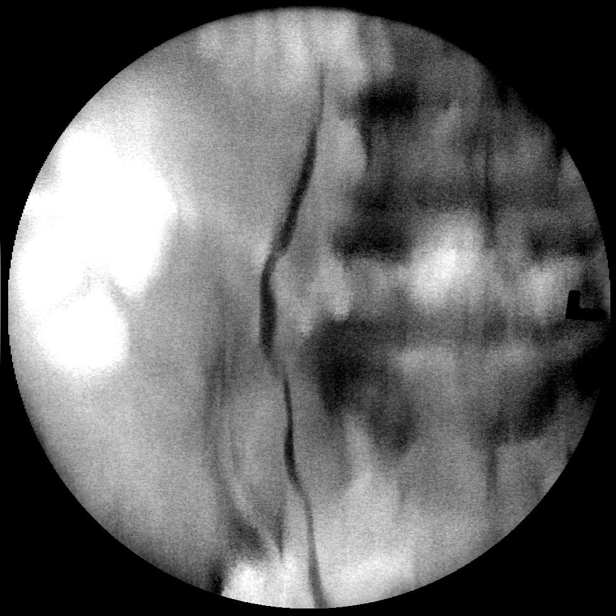
[im 2/8]
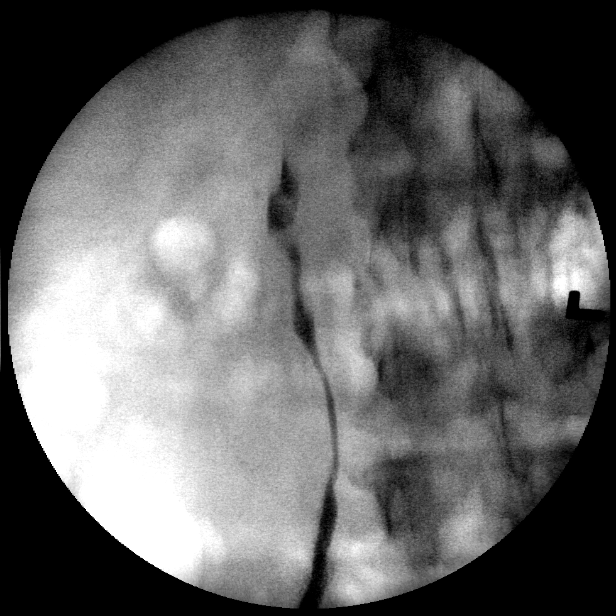
[im 2/8]
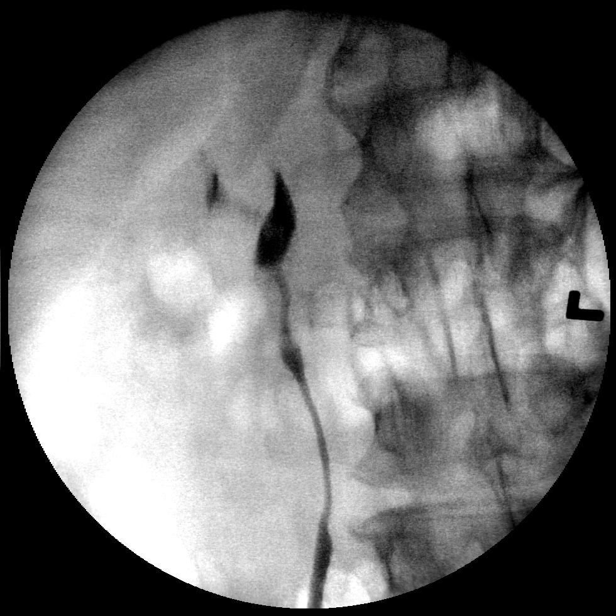
[im 3/8]
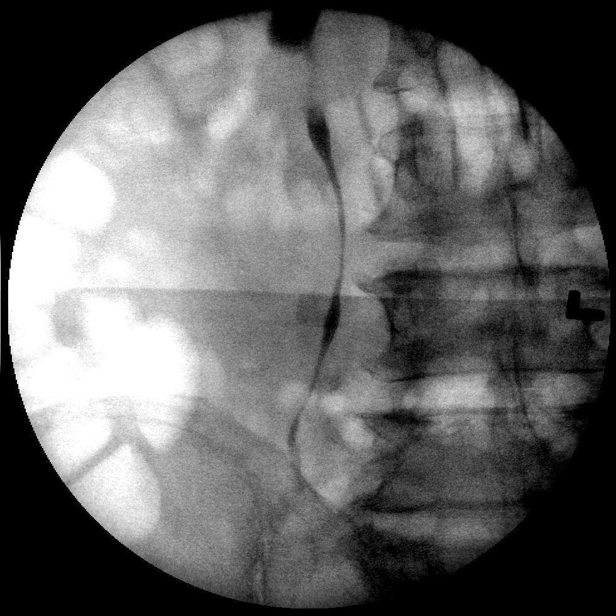
[im 3/8]
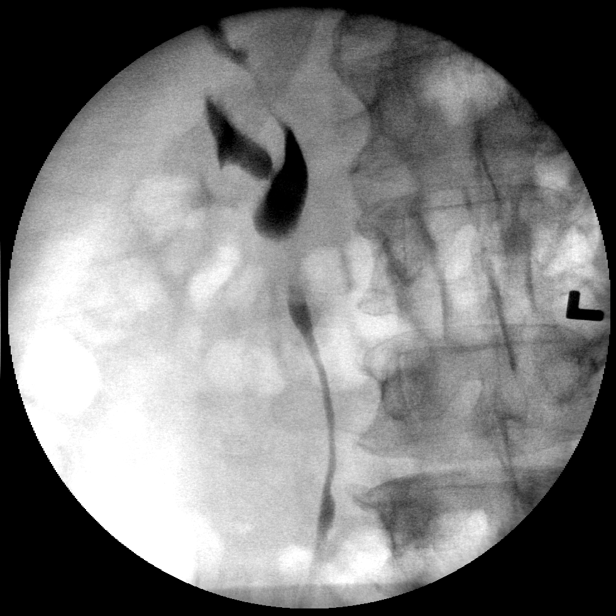
[im 3/8]
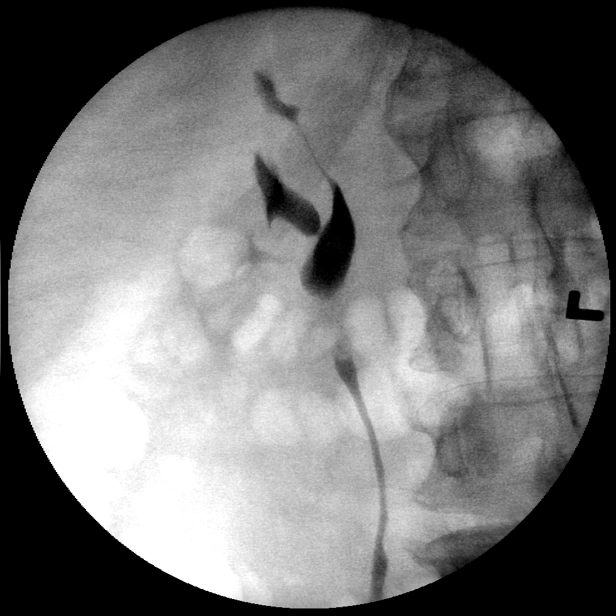
[im 4/8]
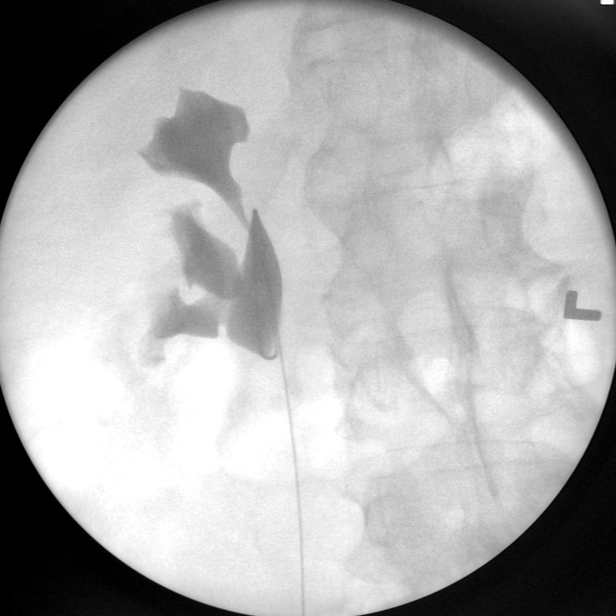
[im 5/8]
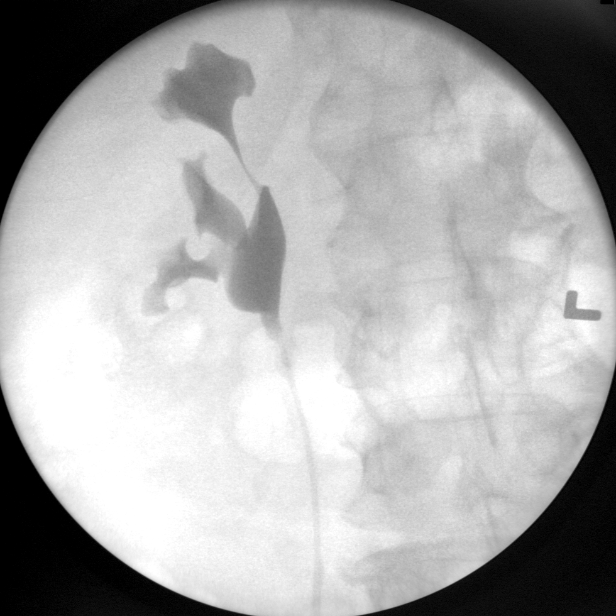
[im 6/8]
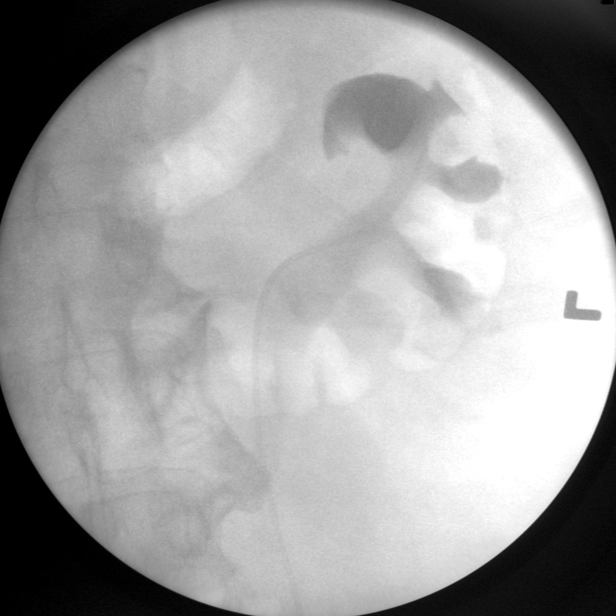
[im 7/8]
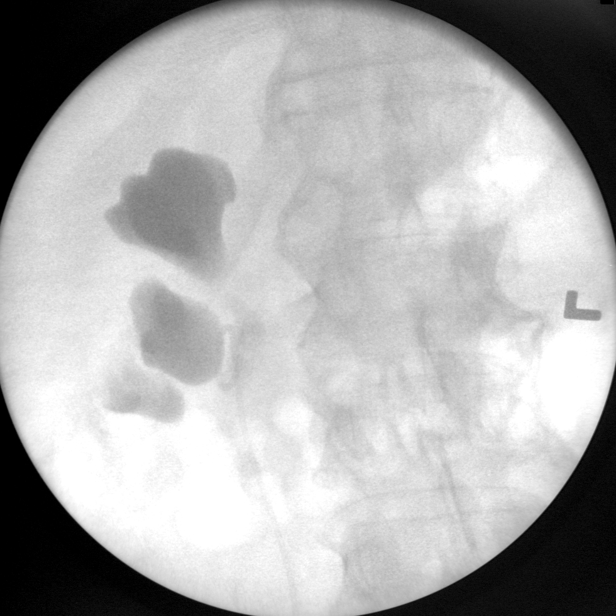
[im 8/8]
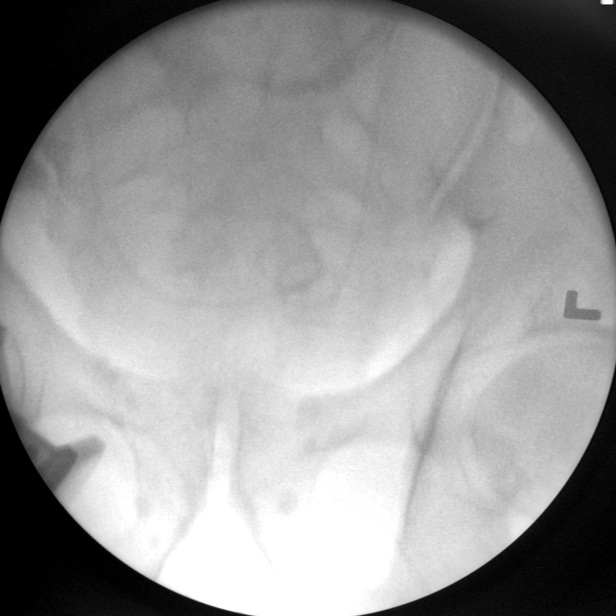

[15 of 16 positions shown; findings below may reference images not displayed]

FINDINGS: Fluoroscopic images are submitted from a retrograde
urethrogram.  The left ureter is cannulated.  The left ureter is
dilated.  Grade III hydronephrosis is present with blunting of the
calyces.

The right ureter is cannulated.  The ureter is of more normal
caliber. Grade III hydronephrosis is present on the right as well.
There is a focal filling defect in the proximal right ureter which
may represent an obstructing stone.

Spot images demonstrate bilateral ureteral stents.  The right sided
upper loop was formed in the pelvis.  Both loops are formed in the
urinary bladder.  The only image provided of the left kidney shows
a non looped stent extending into the upper pole calix.
IMPRESSION: 1.  Bilateral grade 3 hydronephrosis.
2.  Interval placement of bilateral double J ureteral stents.
There is no image provided of the fully formed loop in the left
kidney.

## 2013-05-09 ENCOUNTER — Encounter (HOSPITAL_COMMUNITY)
Admission: RE | Admit: 2013-05-09 | Discharge: 2013-05-09 | Disposition: A | Discharge status: Home / Self Care | Payer: Self-pay | Source: Ambulatory Visit | Attending: Urology | Admitting: Urology

## 2013-05-09 ENCOUNTER — Encounter (HOSPITAL_COMMUNITY): Payer: Self-pay

## 2013-05-09 DIAGNOSIS — Z01812 Encounter for preprocedural laboratory examination: Secondary | ICD-10-CM | POA: Insufficient documentation

## 2013-05-09 LAB — BASIC METABOLIC PANEL
Calcium: 10.2 mg/dL (ref 8.4–10.5)
Creatinine, Ser: 2.47 mg/dL — ABNORMAL HIGH (ref 0.50–1.35)
GFR calc Af Amer: 31 mL/min — ABNORMAL LOW (ref >90)
GFR calc non Af Amer: 27 mL/min — ABNORMAL LOW (ref >90)

## 2013-05-09 LAB — HEMOGLOBIN AND HEMATOCRIT, BLOOD
HCT: 39.2 % (ref 39.0–52.0)
Hemoglobin: 12.9 g/dL — ABNORMAL LOW (ref 13.0–17.0)

## 2013-05-09 NOTE — Patient Instructions (Addendum)
[name]  05/09/2013   Your procedure is scheduled on:  05/16/2013  Report to [facility] at  615  AM.  Call this number if you have problems the morning of surgery: [phone]   Remember:   Do not eat food or drink liquids after midnight.   Take these medicines the morning of surgery with A SIP OF WATER:  Lisinopril, percocet   Do not wear jewelry, make-up or nail polish.  Do not wear lotions, powders, or perfumes.   Do not shave 48 hours prior to surgery. Men may shave face and neck.  Do not bring valuables to the hospital.  [facility] is not responsible  for any belongings or valuables.  Contacts, dentures or bridgework may not be worn into surgery.  Leave suitcase in the car. After surgery it may be brought to your room.  For patients admitted to the hospital, checkout time is 11:00 AM the day of discharge.   Patients discharged the day of surgery will not be allowed to drive home.  Name and phone number of your driver: family  Special Instructions: N/A   Please read over the following fact sheets that you were given: Pain Booklet, Coughing and Deep Breathing, Surgical Site Infection Prevention, Anesthesia Post-op Instructions and Care and Recovery After Surgery Cystoscopy Cystoscopy is a procedure that is used to help your caregiver diagnose and sometimes treat conditions that affect your lower urinary tract. Your lower urinary tract includes your bladder and the tube through which urine passes from your bladder out of your body (urethra). Cystoscopy is performed with a thin, tube-shaped instrument (cystoscope). The cystoscope has lenses and a light at the end so that your caregiver can see inside your bladder. The cystoscope is inserted at the entrance of your urethra. Your caregiver guides it through your urethra and into your bladder. There are two main types of cystoscopy:  Flexible cystoscopy (with a flexible cystoscope).  Rigid cystoscopy (with a rigid  cystoscope). Cystoscopy may be recommended for many conditions, including:  Urinary tract infections.  Blood in your urine (hematuria).  Loss of bladder control (urinary incontinence) or overactive bladder.  Unusual cells found in a urine sample.  Urinary blockage.  Painful urination. Cystoscopy may also be done to remove a sample of your tissue to be checked under a microscope (biopsy). It may also be done to remove or destroy bladder stones. LET YOUR CAREGIVER KNOW ABOUT:  Allergies to food or medicine.  Medicines taken, including vitamins, herbs, eyedrops, over-the-counter medicines, and creams.  Use of steroids (by mouth or creams).  Previous problems with anesthetics or numbing medicines.  History of bleeding problems or blood clots.  Previous surgery.  Other health problems, including diabetes and kidney problems.  Possibility of pregnancy, if this applies. PROCEDURE The area around the opening to your urethra will be cleaned. A medicine to numb your urethra (local anesthetic) is used. If a tissue sample or stone is removed during the procedure, you may be given a medicine to make you sleep (general anesthetic). Your caregiver will gently insert the tip of the cystoscope into your urethra. The cystoscope will be slowly glided through your urethra and into your bladder. Sterile fluid will flow through the cystoscope and into your bladder. The fluid will expand and stretch your bladder. This gives your caregiver a better view of your bladder walls. The procedure lasts about 15 20 minutes. AFTER THE PROCEDURE If a local anesthetic is used, you will  be allowed to go home as soon as you are ready. If a general anesthetic is used, you will be taken to a recovery area until you are stable. You may have temporary bleeding and burning on urination. Document Released: 10/07/2000 Document Revised: 07/04/2012 Document Reviewed: 04/02/2012 [facility] Patient Information 2014 [place],  [place]. PATIENT INSTRUCTIONS POST-ANESTHESIA  IMMEDIATELY FOLLOWING SURGERY:  Do not drive or operate machinery for the first twenty four hours after surgery.  Do not make any important decisions for twenty four hours after surgery or while taking narcotic pain medications or sedatives.  If you develop intractable nausea and vomiting or a severe headache please notify your doctor immediately.  FOLLOW-UP:  Please make an appointment with your surgeon as instructed. You do not need to follow up with anesthesia unless specifically instructed to do so.  WOUND CARE INSTRUCTIONS (if applicable):  Keep a dry clean dressing on the anesthesia/puncture wound site if there is drainage.  Once the wound has quit draining you may leave it open to air.  Generally you should leave the bandage intact for twenty four hours unless there is drainage.  If the epidural site drains for more than 36-48 hours please call the anesthesia department.  QUESTIONS?:  Please feel free to call your physician or the hospital operator if you have any questions, and they will be happy to assist you.

## 2013-05-16 ENCOUNTER — Encounter (HOSPITAL_COMMUNITY): Payer: Self-pay | Admitting: Anesthesiology

## 2013-05-16 ENCOUNTER — Ambulatory Visit (HOSPITAL_COMMUNITY)
Admission: RE | Admit: 2013-05-16 | Discharge: 2013-05-16 | Disposition: A | Discharge status: Home / Self Care | Payer: Self-pay | Source: Ambulatory Visit | Attending: Urology | Admitting: Urology

## 2013-05-16 ENCOUNTER — Ambulatory Visit (HOSPITAL_COMMUNITY): Payer: Self-pay | Admitting: Anesthesiology

## 2013-05-16 ENCOUNTER — Encounter (HOSPITAL_COMMUNITY): Payer: Self-pay

## 2013-05-16 ENCOUNTER — Ambulatory Visit (HOSPITAL_COMMUNITY): Payer: Self-pay

## 2013-05-16 ENCOUNTER — Encounter (HOSPITAL_COMMUNITY)
Admission: RE | Disposition: A | Discharge status: Home / Self Care | Payer: Self-pay | Source: Ambulatory Visit | Attending: Urology

## 2013-05-16 DIAGNOSIS — E669 Obesity, unspecified: Secondary | ICD-10-CM | POA: Insufficient documentation

## 2013-05-16 DIAGNOSIS — E538 Deficiency of other specified B group vitamins: Secondary | ICD-10-CM | POA: Insufficient documentation

## 2013-05-16 DIAGNOSIS — Z6832 Body mass index (BMI) 32.0-32.9, adult: Secondary | ICD-10-CM | POA: Insufficient documentation

## 2013-05-16 DIAGNOSIS — N200 Calculus of kidney: Secondary | ICD-10-CM | POA: Insufficient documentation

## 2013-05-16 DIAGNOSIS — N201 Calculus of ureter: Secondary | ICD-10-CM | POA: Insufficient documentation

## 2013-05-16 DIAGNOSIS — I10 Essential (primary) hypertension: Secondary | ICD-10-CM | POA: Insufficient documentation

## 2013-05-16 HISTORY — PX: STONE EXTRACTION WITH BASKET: SHX5318

## 2013-05-16 HISTORY — PX: CYSTOSCOPY W/ URETERAL STENT PLACEMENT: SHX1429

## 2013-05-16 SURGERY — CYSTOSCOPY, WITH RETROGRADE PYELOGRAM AND URETERAL STENT INSERTION
Anesthesia: Spinal | Site: Ureter | Laterality: Bilateral | Wound class: Clean Contaminated

## 2013-05-16 MED ORDER — SODIUM CHLORIDE 0.9 % IR SOLN
Status: DC | PRN
  Administered 2013-05-16 (×4): 3000 mL

## 2013-05-16 MED ORDER — GENTAMICIN IN SALINE 1.6-0.9 MG/ML-% IV SOLN
INTRAVENOUS | Status: DC | PRN
  Administered 2013-05-16: 80 mg via INTRAVENOUS

## 2013-05-16 MED ORDER — MIDAZOLAM HCL 2 MG/2ML IJ SOLN
1.0000 mg | INTRAMUSCULAR | Status: DC | PRN
  Administered 2013-05-16: 2 mg via INTRAVENOUS

## 2013-05-16 MED ORDER — CEFAZOLIN SODIUM 1-5 GM-% IV SOLN
INTRAVENOUS | Status: AC
  Filled 2013-05-16: qty 50

## 2013-05-16 MED ORDER — LIDOCAINE HCL (CARDIAC) 10 MG/ML IV SOLN
INTRAVENOUS | Status: DC | PRN
  Administered 2013-05-16: 50 mg via INTRAVENOUS

## 2013-05-16 MED ORDER — LACTATED RINGERS IV SOLN
INTRAVENOUS | Status: DC | PRN
  Administered 2013-05-16 (×2): via INTRAVENOUS

## 2013-05-16 MED ORDER — STERILE WATER FOR IRRIGATION IR SOLN
Status: DC | PRN
  Administered 2013-05-16 (×2): 1000 mL

## 2013-05-16 MED ORDER — PHENYLEPHRINE HCL 10 MG/ML IJ SOLN
INTRAMUSCULAR | Status: DC | PRN
  Administered 2013-05-16 (×4): 100 ug via INTRAVENOUS

## 2013-05-16 MED ORDER — GENTAMICIN SULFATE 40 MG/ML IJ SOLN
INTRAMUSCULAR | Status: AC
  Filled 2013-05-16: qty 2

## 2013-05-16 MED ORDER — FENTANYL CITRATE 0.05 MG/ML IJ SOLN
25.0000 ug | INTRAMUSCULAR | Status: AC
  Administered 2013-05-16: 08:00:00 via INTRAVENOUS

## 2013-05-16 MED ORDER — CEFAZOLIN SODIUM 1-5 GM-% IV SOLN
INTRAVENOUS | Status: DC | PRN
  Administered 2013-05-16: 1 g via INTRAVENOUS

## 2013-05-16 MED ORDER — FENTANYL CITRATE 0.05 MG/ML IJ SOLN
INTRAMUSCULAR | Status: DC | PRN
  Administered 2013-05-16 (×2): 25 ug via INTRAVENOUS
  Administered 2013-05-16: 25 ug via INTRATHECAL
  Administered 2013-05-16 (×5): 25 ug via INTRAVENOUS

## 2013-05-16 MED ORDER — LACTATED RINGERS IV SOLN
INTRAVENOUS | Status: DC
  Administered 2013-05-16: 08:00:00 via INTRAVENOUS

## 2013-05-16 MED ORDER — MIDAZOLAM HCL 5 MG/5ML IJ SOLN
INTRAMUSCULAR | Status: DC | PRN
  Administered 2013-05-16 (×2): 2 mg via INTRAVENOUS

## 2013-05-16 MED ORDER — 0.9 % SODIUM CHLORIDE (POUR BTL) OPTIME
TOPICAL | Status: DC | PRN
  Administered 2013-05-16 (×2): 1000 mL

## 2013-05-16 MED ORDER — FENTANYL CITRATE 0.05 MG/ML IJ SOLN
INTRAMUSCULAR | Status: AC
  Filled 2013-05-16: qty 2

## 2013-05-16 MED ORDER — MIDAZOLAM HCL 2 MG/2ML IJ SOLN
INTRAMUSCULAR | Status: AC
  Filled 2013-05-16: qty 2

## 2013-05-16 MED ORDER — GENTAMICIN IN SALINE 1.6-0.9 MG/ML-% IV SOLN: INTRAVENOUS | Status: DC | PRN

## 2013-05-16 MED ORDER — IOHEXOL 350 MG/ML SOLN
INTRAVENOUS | Status: DC | PRN
  Administered 2013-05-16 (×2): 50 mL

## 2013-05-16 MED ORDER — ONDANSETRON HCL 4 MG/2ML IJ SOLN: 4.0000 mg | Freq: Once | INTRAMUSCULAR | Status: DC | PRN

## 2013-05-16 MED ORDER — FENTANYL CITRATE 0.05 MG/ML IJ SOLN: 25.0000 ug | INTRAMUSCULAR | Status: DC | PRN

## 2013-05-16 MED ORDER — PROPOFOL INFUSION 10 MG/ML OPTIME
INTRAVENOUS | Status: DC | PRN
  Administered 2013-05-16 (×2): 75 ug/kg/min via INTRAVENOUS
  Administered 2013-05-16 (×2): 40 ug/kg/min via INTRAVENOUS
  Administered 2013-05-16: 11:00:00 via INTRAVENOUS

## 2013-05-16 MED ORDER — BUPIVACAINE IN DEXTROSE 0.75-8.25 % IT SOLN
INTRATHECAL | Status: DC | PRN
  Administered 2013-05-16: 15 mg via INTRATHECAL

## 2013-05-16 SURGICAL SUPPLY — 31 items
BAG DRAIN URO TABLE W/ADPT NS (DRAPE) ×3 IMPLANT
BAG DRN 8 ADPR NS SKTRN CSTL (DRAPE) ×2
BASKET LASER NITINOL 1.9FR (BASKET) ×2 IMPLANT
BSKT STON RTRVL 120 1.9FR (BASKET) ×2
CATH 5 FR WEDGE TIP (UROLOGICAL SUPPLIES) ×3 IMPLANT
CATH OPEN TIP 5FR (CATHETERS) ×3 IMPLANT
CLOTH BEACON ORANGE TIMEOUT ST (SAFETY) ×3 IMPLANT
DILATOR UROMAX ULTRA (MISCELLANEOUS) IMPLANT
GLOVE BIO SURGEON STRL SZ7 (GLOVE) ×3 IMPLANT
GLOVE BIOGEL PI IND STRL 7.0 (GLOVE) ×1 IMPLANT
GLOVE BIOGEL PI INDICATOR 7.0 (GLOVE) ×1
GLOVE EXAM NITRILE MD LF STRL (GLOVE) ×2 IMPLANT
GLOVE SS BIOGEL STRL SZ 6.5 (GLOVE) ×1 IMPLANT
GLOVE SUPERSENSE BIOGEL SZ 6.5 (GLOVE) ×1
GOWN STRL REIN XL XLG (GOWN DISPOSABLE) ×3 IMPLANT
IV NS IRRIG 3000ML ARTHROMATIC (IV SOLUTION) ×10 IMPLANT
KIT ROOM TURNOVER AP CYSTO (KITS) ×3 IMPLANT
LASER FIBER DISP (UROLOGICAL SUPPLIES) IMPLANT
LASER FIBER DISP 1000U (UROLOGICAL SUPPLIES) IMPLANT
MANIFOLD NEPTUNE II (INSTRUMENTS) ×3 IMPLANT
NS IRRIG 1000ML POUR BTL (IV SOLUTION) ×4 IMPLANT
PACK CYSTO (CUSTOM PROCEDURE TRAY) ×3 IMPLANT
PAD ARMBOARD 7.5X6 YLW CONV (MISCELLANEOUS) ×3 IMPLANT
SET IRRIGATING DISP (SET/KITS/TRAYS/PACK) ×3 IMPLANT
SHEATH URET ACCESS 12FR/55CM (UROLOGICAL SUPPLIES) ×2 IMPLANT
STENT PERCUFLEX 4.8FRX24 (STENTS) ×4 IMPLANT
STONE RETRIEVAL GEMINI 2.4 FR (MISCELLANEOUS) IMPLANT
TOWEL OR 17X26 4PK STRL BLUE (TOWEL DISPOSABLE) ×3 IMPLANT
VALVE DISPOSABLE (MISCELLANEOUS) ×2 IMPLANT
WATER STERILE IRR 1000ML POUR (IV SOLUTION) ×4 IMPLANT
WIRE GUIDE BENTSON .035 15CM (WIRE) ×7 IMPLANT

## 2013-05-16 NOTE — Progress Notes (Signed)
Still waiting on spinal to go away. Still cannot lift bottom off of bed. Reported to gina witherspoon rn

## 2013-05-16 NOTE — Anesthesia Preprocedure Evaluation (Addendum)
Anesthesia Evaluation  Patient identified by MRN, date of birth, ID band Patient awake    Reviewed: Allergy & Precautions, H&P , NPO status , Patient's Chart, lab work & pertinent test results  History of Anesthesia Complications Negative for: history of anesthetic complications  Airway Mallampati: II TM Distance: >3 FB Neck ROM: Full    Dental  (+) Teeth Intact, Partial Lower and Partial Upper   Pulmonary neg pulmonary ROS,  breath sounds clear to auscultation        Cardiovascular hypertension, Pt. on medications Rhythm:Regular Rate:Normal     Neuro/Psych    GI/Hepatic   Endo/Other    Renal/GU Renal disease (obstructive uropathy, stents)     Musculoskeletal   Abdominal   Peds  Hematology  (+) Blood dyscrasia (B12 def), anemia ,   Anesthesia Other Findings Spinal was difficult to place; many attempts  Reproductive/Obstetrics                          Anesthesia Physical Anesthesia Plan  ASA: II  Anesthesia Plan: Spinal   Post-op Pain Management:    Induction:   Airway Management Planned: Nasal Cannula  Additional Equipment:   Intra-op Plan:   Post-operative Plan:   Informed Consent: I have reviewed the patients History and Physical, chart, labs and discussed the procedure including the risks, benefits and alternatives for the proposed anesthesia with the patient or authorized representative who has indicated his/her understanding and acceptance.     Plan Discussed with:   Anesthesia Plan Comments:         Anesthesia Quick Evaluation

## 2013-05-16 NOTE — Anesthesia Procedure Notes (Addendum)
Date/Time: 05/16/2013 8:04 AM Performed by: Carolyne Littles, Natesha Hassey L Pre-anesthesia Checklist: Patient identified, Timeout performed, Emergency Drugs available, Suction available and Patient being monitored Oxygen Delivery Method: Non-rebreather mask    Spinal   Spinal  Patient location during procedure: OR Start time: 05/16/2013 8:40 AM Staffing Anesthesiologist: Laurene Footman CRNA/Resident: ANDRAZA, Maddisyn Hegwood L Preanesthetic Checklist Completed: patient identified, site marked, surgical consent, pre-op evaluation, timeout performed, IV checked, risks and benefits discussed and monitors and equipment checked Spinal Block Patient position: right lateral decubitus Prep: Betadine Patient monitoring: heart rate, cardiac monitor, continuous pulse ox and blood pressure Approach: right paramedian Location: L3-4 Injection technique: single-shot Needle Needle type: Spinocan  Needle gauge: 22 G Needle length: 9 cm Assessment Sensory level: T8 Additional Notes ATTEMPTS:Multiple TRAY [id number] TRAY EXPIRATION DATE:09/2013  Marcaine 15mg , Fentanyl , epi.1 injected intrathecally at 0840 after multiple attempts.

## 2013-05-16 NOTE — Transfer of Care (Signed)
Immediate Anesthesia Transfer of Care Note  Patient: [name]  Procedure(s) Performed: Procedure(s) (LRB): CYSTOSCOPY WITH BILATERAL RETROGRADE PYELOGRAM/BILATERAL URETERAL STENT REMOVAL/BILATERAL URETERAL STENT PLACEMENT (Bilateral) STONE EXTRACTION WITH BASKET (Bilateral)  Patient Location: PACU  Anesthesia Type: SAB  Level of Consciousness: awake  Airway & Oxygen Therapy: Patient Spontanous Breathing and  Post-op Assessment: Report given to PACU RN, Post -op Vital signs reviewed and stable. SAB Level  T 12  Post vital signs: Reviewed and stable  Complications: No apparent anesthesia complications

## 2013-05-16 NOTE — Progress Notes (Signed)
No change in H&P on reexamination. 

## 2013-05-16 NOTE — Anesthesia Postprocedure Evaluation (Signed)
Anesthesia Post Note  Patient: [name]  Procedure(s) Performed: Procedure(s) (LRB): CYSTOSCOPY WITH BILATERAL RETROGRADE PYELOGRAM/BILATERAL URETERAL STENT REMOVAL/BILATERAL URETERAL STENT PLACEMENT (Bilateral) STONE EXTRACTION WITH BASKET (Bilateral)  Anesthesia type: Spinal  Patient location: PACU  Post pain: Pain level controlled  Post assessment: Post-op Vital signs reviewed, Patient's Cardiovascular Status Stable, Respiratory Function Stable, Patent Airway, No signs of Nausea or vomiting and Pain level controlled  Last Vitals:  Filed Vitals:   05/16/13 1136  BP: 120/81  Pulse: 88  Temp: 36.6 C  Resp: 14    Post vital signs: Reviewed and stable  Level of consciousness: awake and alert   Complications: No apparent anesthesia complications

## 2013-05-16 NOTE — Preoperative (Signed)
Beta Blockers   Reason not to administer Beta Blockers:Not Applicable 

## 2013-05-16 NOTE — H&P (Signed)
NAMESAVINO, WHISENANT             ACCOUNT NO.:  [id number]  MEDICAL RECORD NO.:  [id number]  LOCATION:                                 FACILITY:  PHYSICIAN:  Ky Barban, M.D.DATE OF BIRTH:  [birth year]  DATE OF ADMISSION:  05/16/2013 DATE OF DISCHARGE:  LH                             HISTORY & PHYSICAL   The patient is a 62 year old gentleman, who is well known to me last year.  He was admitted in the hospital at [facility] with renal failure. He was found to have bilateral renal calculi causing obstruction.  I inserted double-J stents on both sides and his creatinine started to come down.  Creatinine is down to 2.1, but still completely not normal, much much better.  Few months ago, I took him back to the operating room, he had a double-J stents on both sides.  The right stent was removed and the stone was broken, but I could not get it out, and he still has those couple of pieces of stone in the right kidney with double-J stent.  I could not find the stone in the left kidney, although it is a large stone 2 cm in the lower pole of calix, that is why I left double-J stent on both sides, came out, so I am bringing him back in the hospital again.  I am going to go after these stones, see if I can get that out and no promise I have made with them.  The patient is a very poor historian.  His wife is okay, who knows a little bit more about his problems.  No fever or chills.  He continued to have positive urine culture for which he is on Cipro right now.  He also has a 10-mm stone in the distal left ureter.  Subsequently, he passed it and the left ureter is markedly dilated.  I do not know why I have done ureteroscopy on the left side, and I do not see any other stones, and I have dilated his intramural ureter and that could be one of the reasons, but I could not find the stone in the left kidney.  So, I am going to try to do ureteroscopy and laser lithotripsy of the left renal  calculus if I can find it.  These stones are not visible on KUB, otherwise, I would have done lithotripsy.  I have discussed the procedure, limitation, complication with the patient.  He understands hopefully and it will be done as outpatient.  Last time when I did a left ureteroscopy, also did a random biopsy of the distal left ureter at the ureterovesical junction, but the pathology did not show any malignancy.  No evidence of dysplasia or malignancy.  Just some chronic inflammation.  It was done on Feb 26, 2013.  PAST MEDICAL HISTORY:  History of hypertension, B12 deficiency, history of having kidney stones.  He says I have seen him many years ago for lithotripsy in [place], but I could not recall that.  He never had any surgery.  PERSONAL HISTORY:  He does not smoke or drink.  REVIEW OF SYSTEMS:  Unremarkable.  PHYSICAL EXAMINATION:  GENERAL:  Moderately obese male,  not in acute distress, fully conscious, alert, oriented. VITAL SIGNS:  Blood pressure 122/88, temperature is 97.6. CENTRAL NERVOUS SYSTEM:  No gross neurological deficit. HEAD, NECK, EYES, ENT:  Negative. CHEST:  Symmetrical.  Normal breath sounds. HEART:  Regular sinus rhythm.  No murmur. ABDOMEN:  Soft and flat.  Liver, spleen, and kidneys are not palpable. No CVA tenderness. GU: External genitalia is unremarkable. RECTAL:  Deferred. EXTREMITIES:  Normal.  IMPRESSION:  Bilateral renal calculi and renal failure, and bilateral ureteral stents.  PLAN:  Bilateral ureterorenoscopy, possible laser lithotripsy, and use of double-J stent under anesthesia as outpatient.     Ky Barban, M.D.     MIJ/MEDQ  D:  05/15/2013  T:  05/15/2013  Job:  [id number]

## 2013-05-17 NOTE — Op Note (Signed)
[name], [name]             ACCOUNT NO.:  [id number]  MEDICAL RECORD NO.:  [id number]  LOCATION:  [facility]                          FACILITY:  [facility]  PHYSICIAN:  Ky Barban, M.D.DATE OF BIRTH:  1951-06-02  DATE OF PROCEDURE:  05/16/2013 DATE OF DISCHARGE:  05/16/2013                              OPERATIVE REPORT   PREOPERATIVE DIAGNOSIS:  Bilateral renal calculi.  POSTOPERATIVE DIAGNOSIS:  Bilateral renal calculi.  PROCEDURES:  Ureteroscopic extraction of stone, right renal pelvis, insertion of bilateral ureteral stents.  ANESTHESIA:  Spinal.  DESCRIPTION OF PROCEDURE:  The patient under spinal anesthesia in lithotomy position, after usual prep and drape, #25 cystoscope introduced into the bladder.  The right double-J stent was removed and then a guidewire was passed up into the renal pelvis with the help of ureteroscope and flexible ureteroscope was introduced over the guidewire.  Through the ureteral access tube, I used the longest tube up to the ureteropelvic junction.  Then, Hypaque was injected to outline the collecting system.  There was a filling defect in the lower pole of calyx.  With the help of the flexible scope, I was able to see the stone, but I was never able to find it again.  After considerable attempt, I quit looking for it.  Then, a double-J stent was inserted, then came out and went into the right side with the help of a flexible ureteroscope.  I see the stone in the right upper ureter and there is a filling defect in the lower pole of calyx.  I could not get into the calyx.  After considerable attempt, it was decided to go ahead and get the ureteral stone out.  With the help of the rigid ureteroscope, I was able to basket the large ureteral calculus.  I removed without any complication.  The stone came out, fell in the bladder.  Then, later on, with evacuation, I was able to get it out.  Double-J stent was reinserted on the right side and all  instruments were removed.  Nice loop was obtained from both sides in the bladder.  No complication.  The patient left the operating room in satisfactory condition.     Ky Barban, M.D.     MIJ/MEDQ  D:  05/16/2013  T:  05/17/2013  Job:  [id number]

## 2013-05-18 IMAGING — CT CT ABD-PELV W/O CM
2 of 4 series · 16 of 46 positions shown, 18 images · non-contrast
Comparison: 02/08/2012 and earlier.

CLINICAL DATA: 60-year-old male with difficulty urinating.  History
of renal stents.  Increasing creatinine.

CT ABDOMEN AND PELVIS WITHOUT CONTRAST
TECHNIQUE: Multidetector CT imaging of the abdomen and pelvis was
performed following the standard protocol without intravenous
contrast.

[Series 2: abdomen/pelvis w/o contrast · axial · non-contrast · 0.83mm/px · z∈[-454,-94]mm · 13 of 79 slices shown, 15 images]
[im 4/79  soft-tissue]
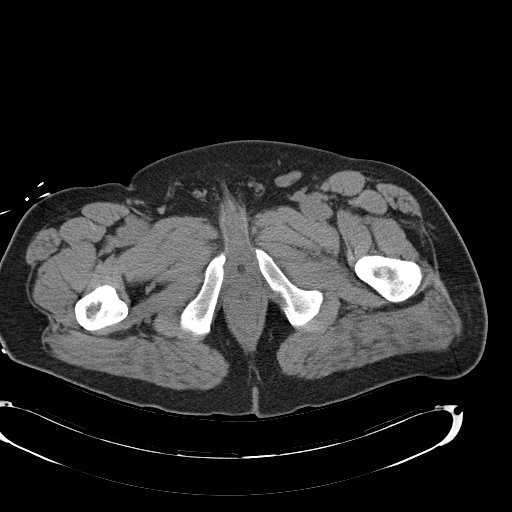
[im 4/79  bone]
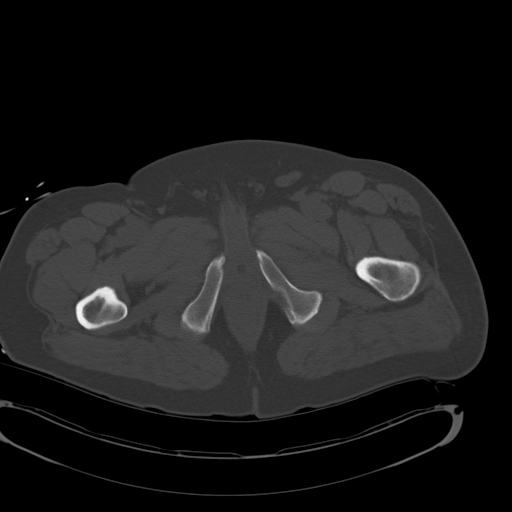
[im 10/79  soft-tissue]
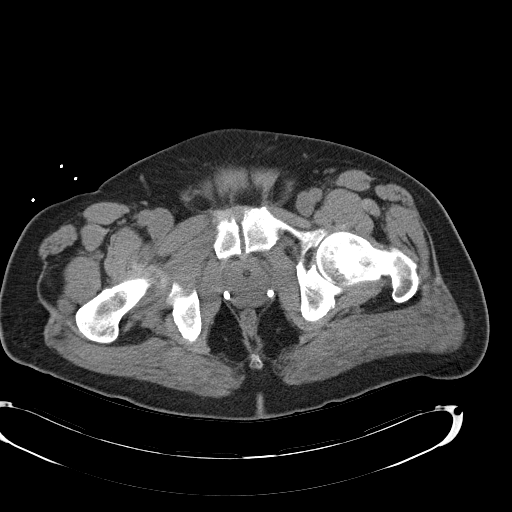
[im 16/79  soft-tissue]
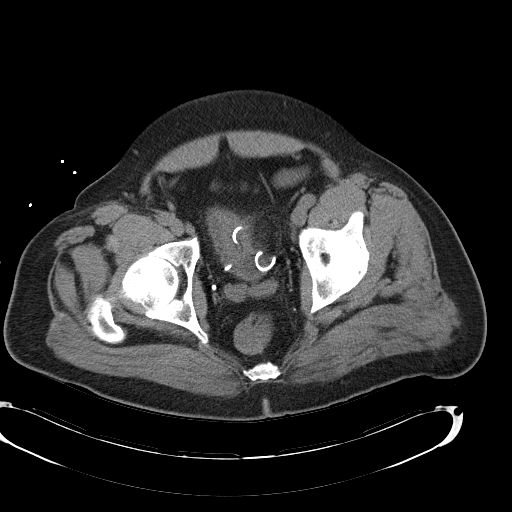
[im 22/79  soft-tissue]
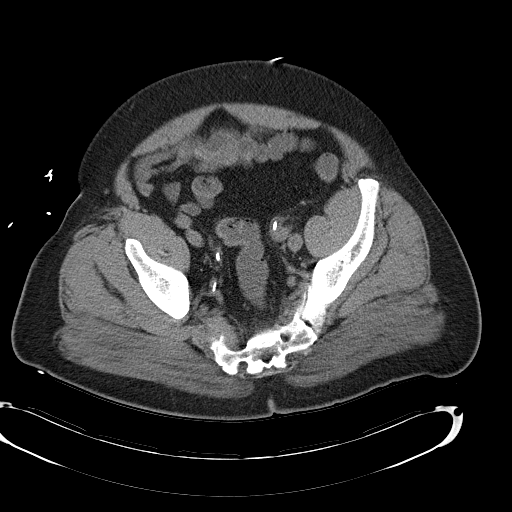
[im 28/79  soft-tissue]
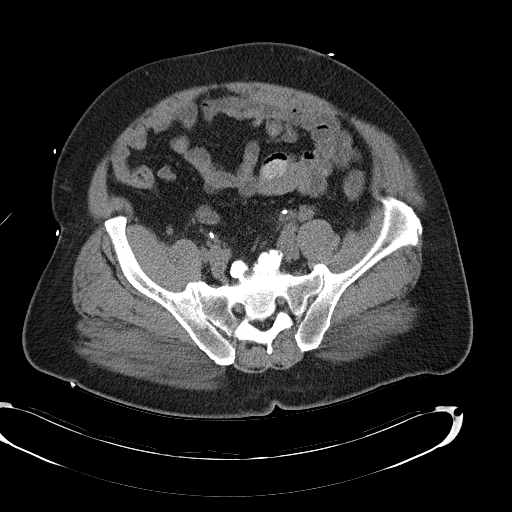
[im 34/79  soft-tissue]
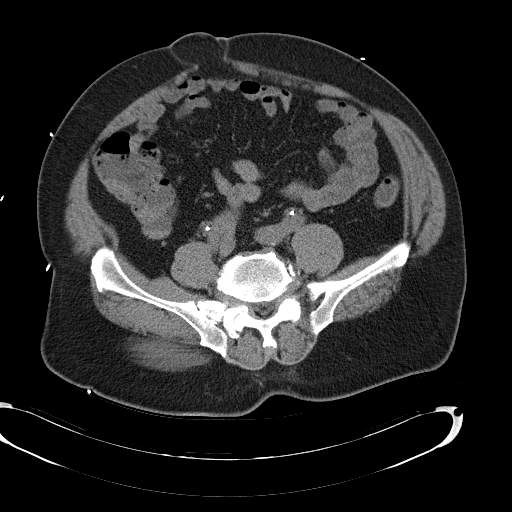
[im 40/79  soft-tissue]
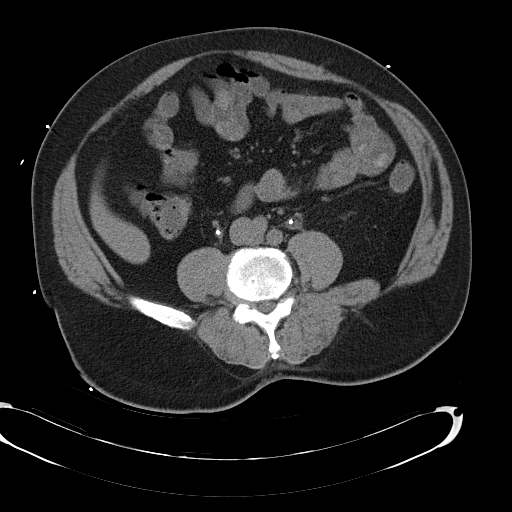
[im 46/79  soft-tissue]
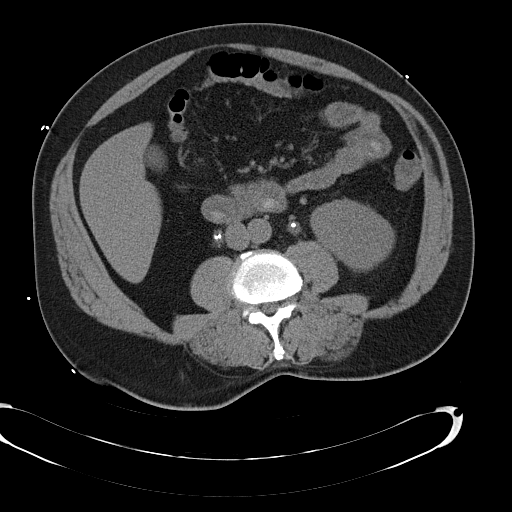
[im 52/79  soft-tissue]
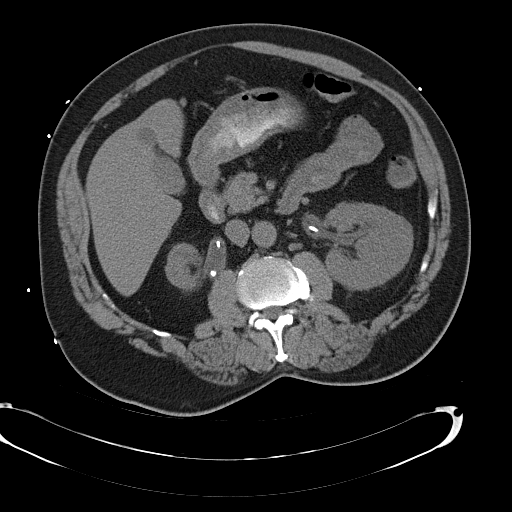
[im 52/79  bone]
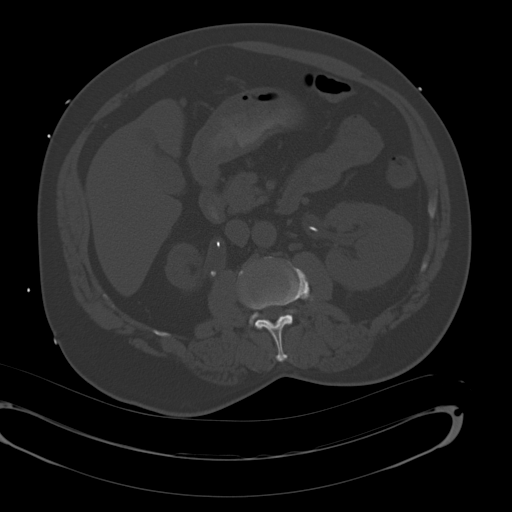
[im 58/79  soft-tissue]
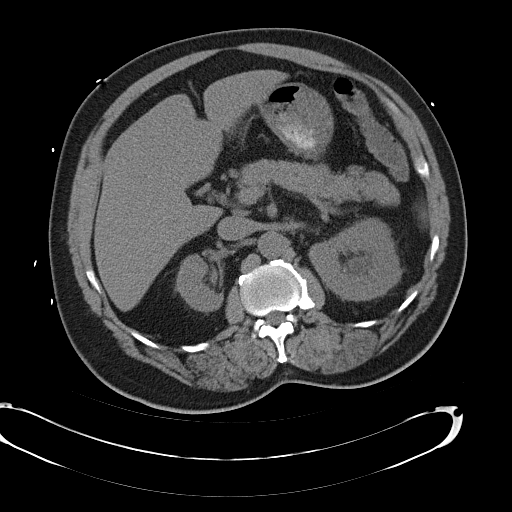
[im 64/79  soft-tissue]
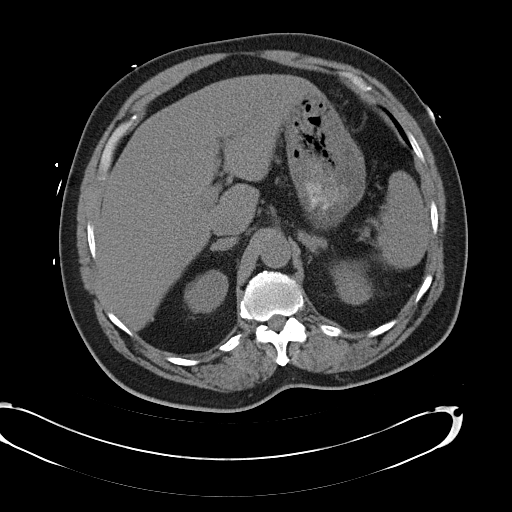
[im 70/79  soft-tissue]
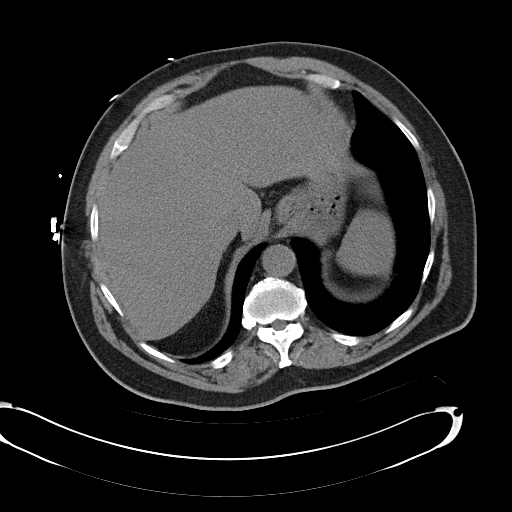
[im 76/79  soft-tissue]
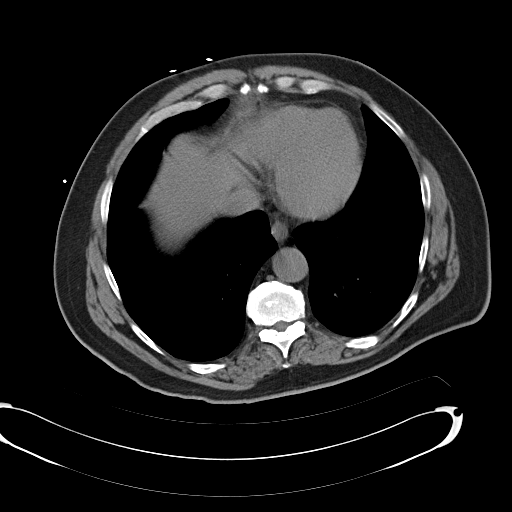

[Series 5: mpr cor (id) · coronal · 0.73mm/px · 3 of 106 slices shown]
[im 36/106  soft-tissue]
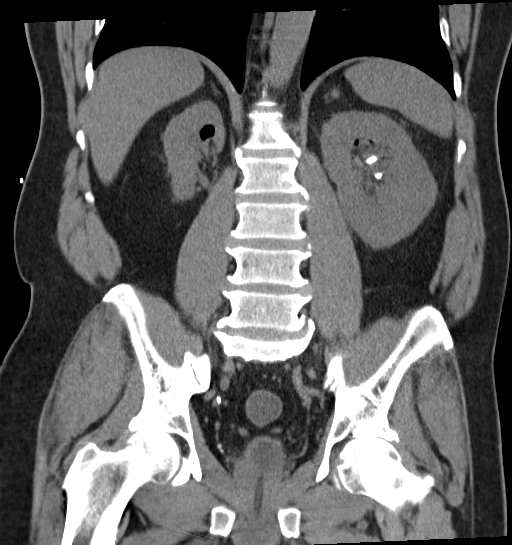
[im 47/106  soft-tissue]
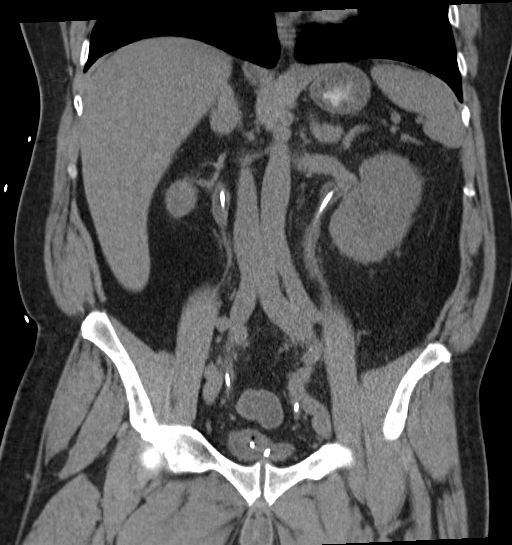
[im 59/106  soft-tissue]
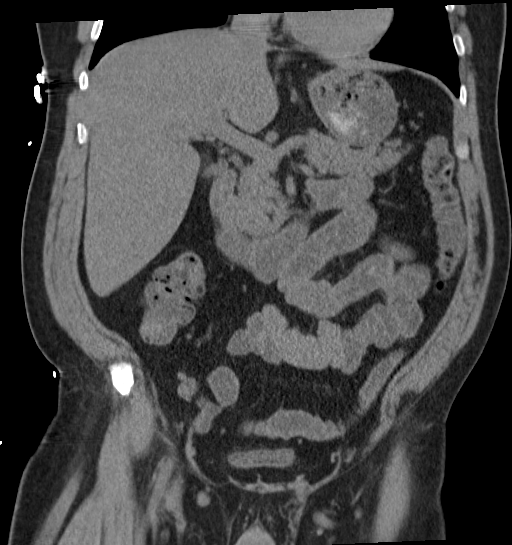

[16 of 46 positions shown; findings below may reference images not displayed]

FINDINGS: Lung bases are clear. Stable visualized osseous
structures.  Degenerative changes in the spine.  Degenerative
changes of the hips.

Foley catheter now present within the bladder which is
decompressed.  Bilateral double J ureteral stent now in place.
Mild bilateral peri ureteral stranding.  Left ureter had is
decompressed from prior.  The right UPJ and proximal ureter calculi
have not significantly changed in position (series 2 images 28 and
33).  Small volume of gas within the right renal collecting system.
No other right side calculus.

On the left the large lower pole intrarenal stone is stable.  No
other left side calculus.  Small volume of left collecting system
gas.

Superimposed chronic right renal atrophy and left nephromegaly.
Mild bilateral perinephric stranding is mildly increased.

No pelvic free fluid.  Distal colon contains fluid and there are
occasional diverticula again noted.  Negative proximal colon and
appendix.  No dilated small bowel loops.  Fat containing umbilical
hernia.  Trace oral contrast in the stomach and duodenum.  Negative
noncontrast liver, gallbladder, spleen, and pancreas.  Stable
bilateral adrenal thickening.  No abdominal free fluid.
IMPRESSION: 1.  Bilateral double-J ureteral stents now in place.  Interval
decompression of the left ureter.
2.  Stable position of the right UPJ and proximal ureter stones.
Stable large left intrarenal stone.
3.  Mild increased perinephric and periureteral stranding is
nonspecific but might reflect stent placement. Small volume
bilateral renal collecting system gas is felt to be related to
stent and Foley catheter placement.
4.  No other acute findings in the abdomen or pelvis.

## 2013-05-20 ENCOUNTER — Encounter (HOSPITAL_COMMUNITY): Payer: Self-pay | Admitting: Urology

## 2013-05-20 IMAGING — US US EXTREM LOW VENOUS*R*
1 series · 14 of 24 positions shown · non-contrast
Comparison: None.

CLINICAL DATA: Right leg pain, evaluate for DVT

RIGHT LOWER EXTREMITY VENOUS DUPLEX ULTRASOUND
TECHNIQUE: Gray-scale sonography with graded compression, as well
as color Doppler and duplex ultrasound, were performed to evaluate
the deep venous system of the lower extremity from the level of the
common femoral vein through the popliteal and proximal calf veins.
Spectral Doppler was utilized to evaluate flow at rest and with
distal augmentation maneuvers.

[Series 1: us extrem low venous*right* · 28 acquisitions, 14 frames shown]
[im 1/28]
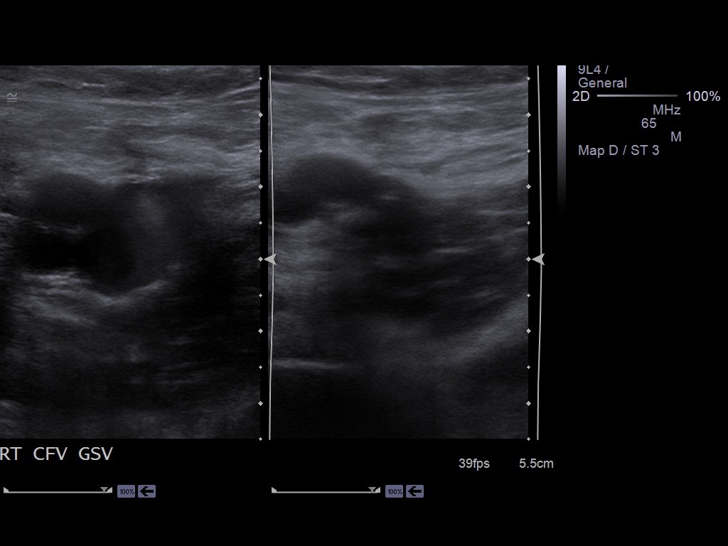
[im 3/28]
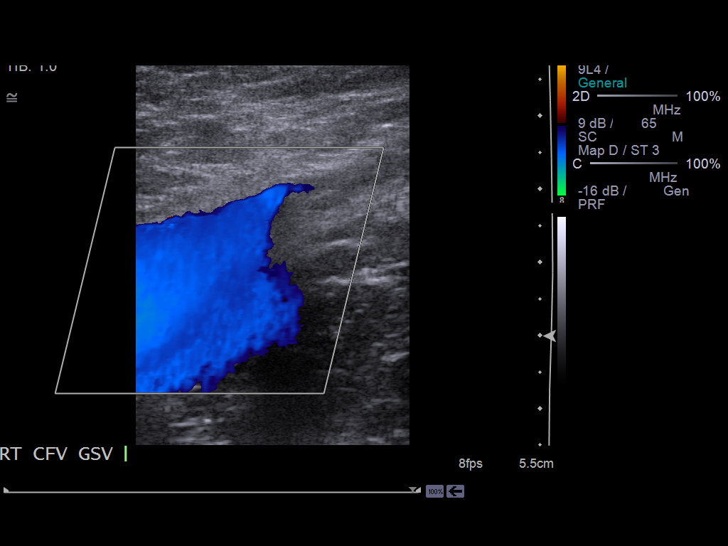
[im 5/28]
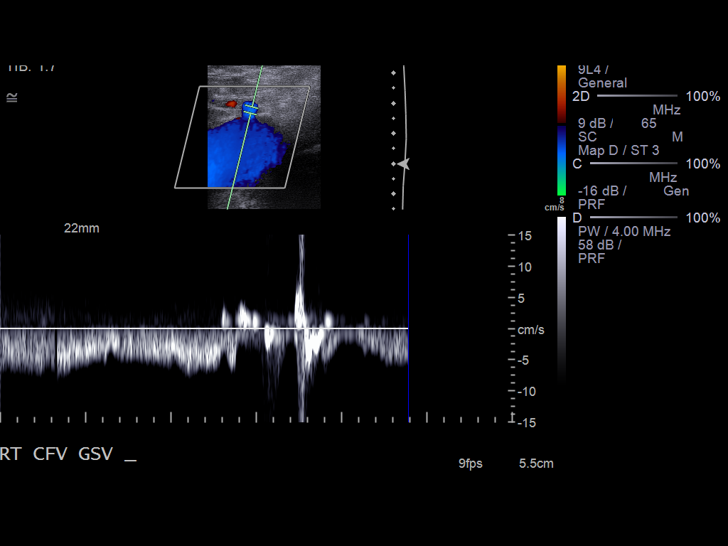
[im 8/28]
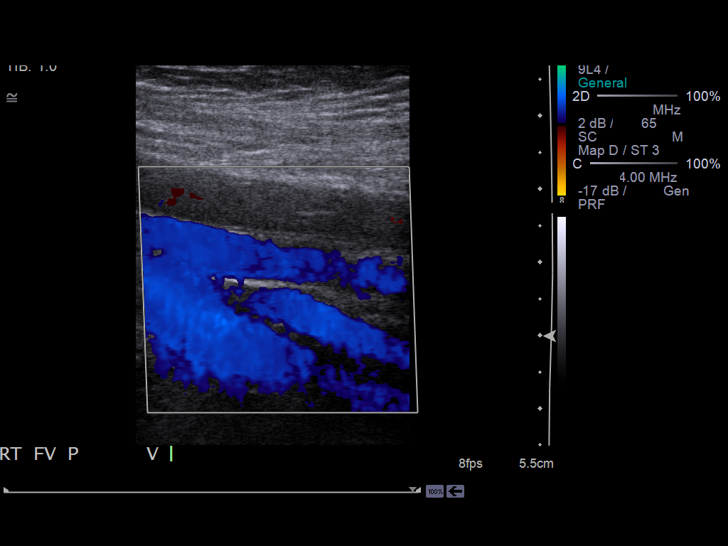
[im 9/28]
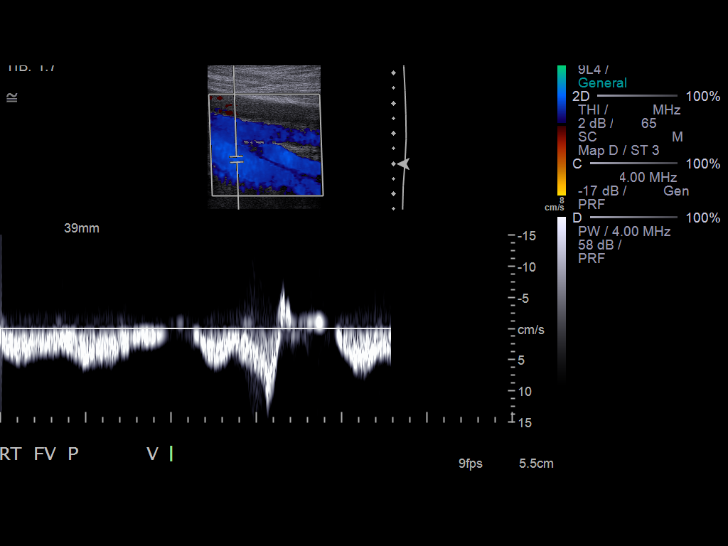
[im 11/28]
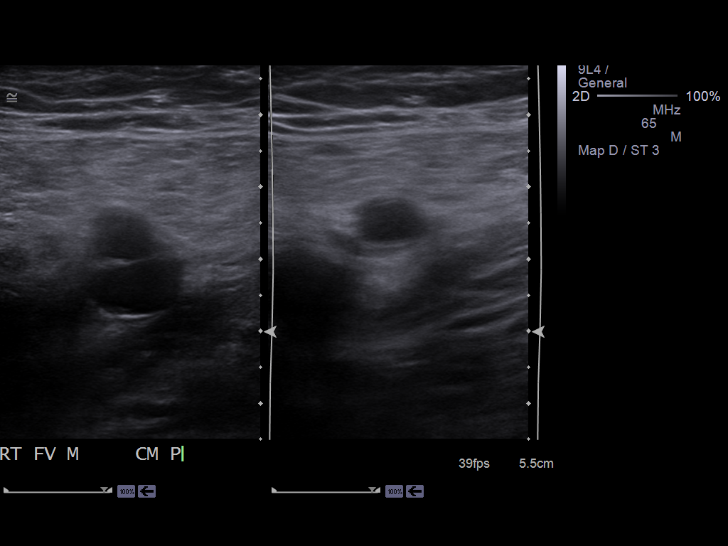
[im 13/28]
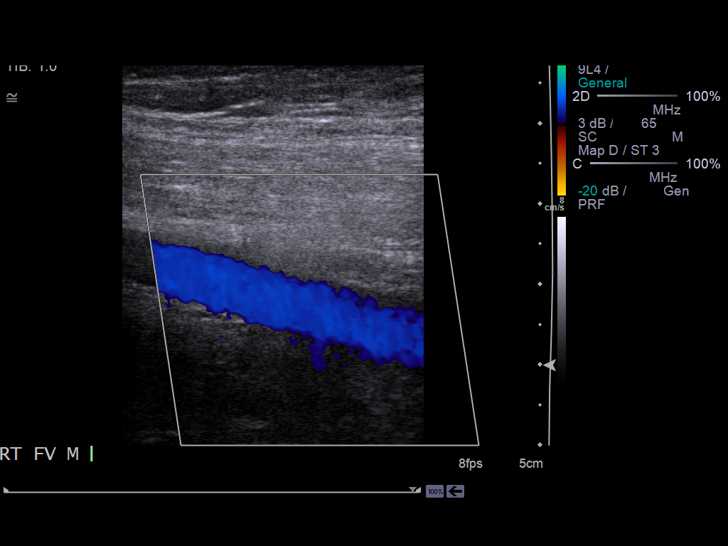
[im 16/28]
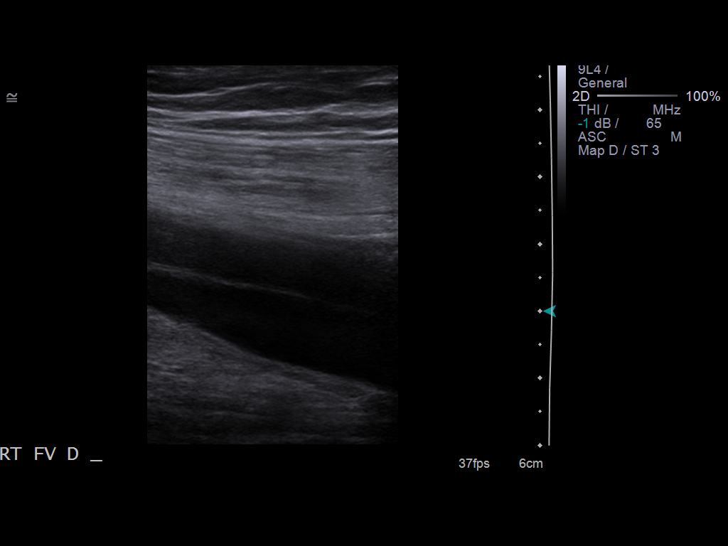
[im 18/28]
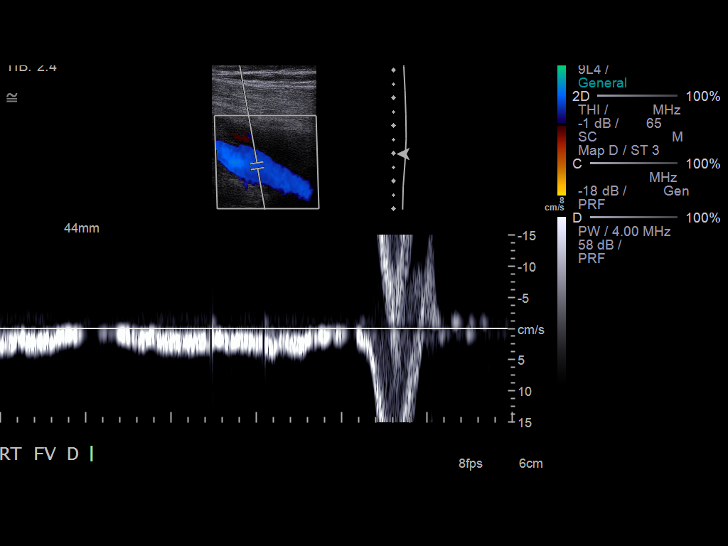
[im 20/28]
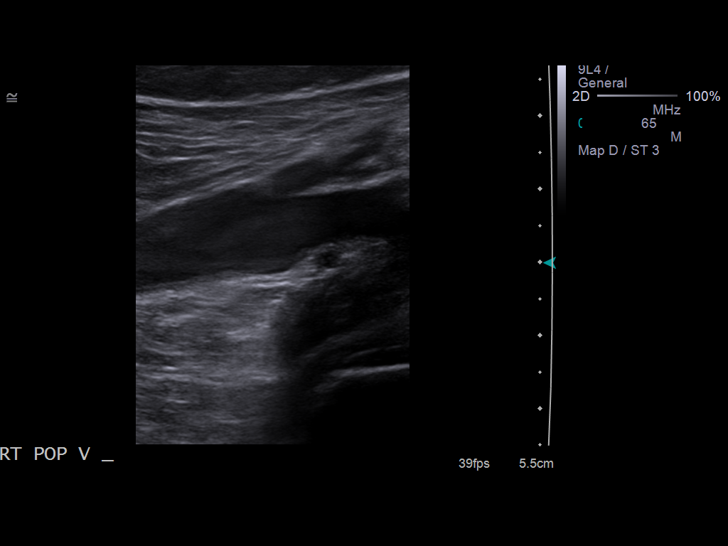
[im 23/28]
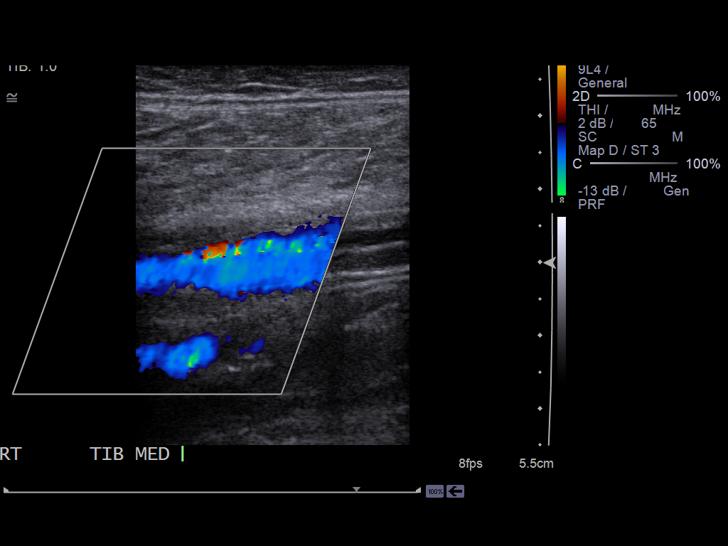
[im 24/28]
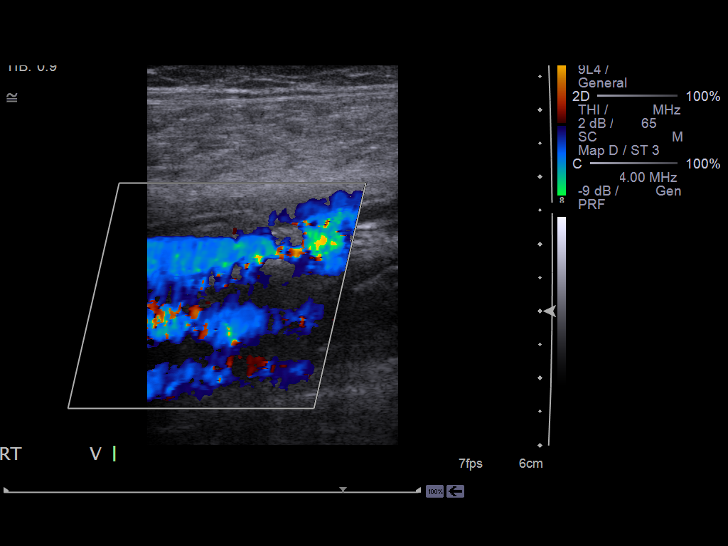
[im 26/28]
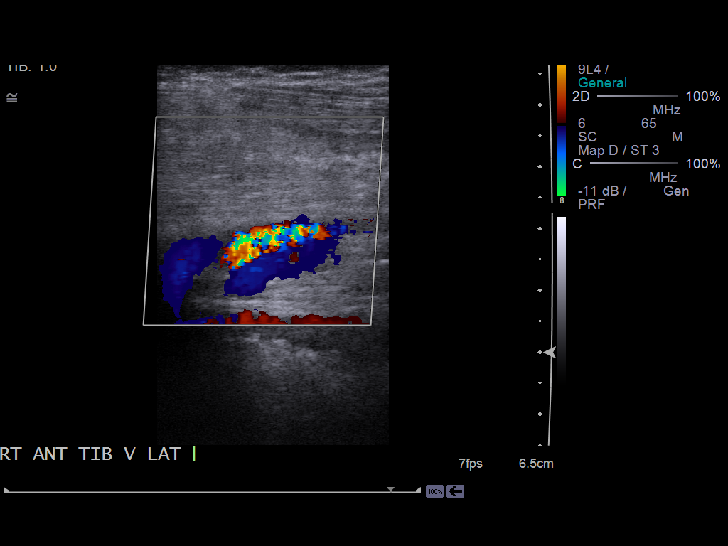
[im 28/28]
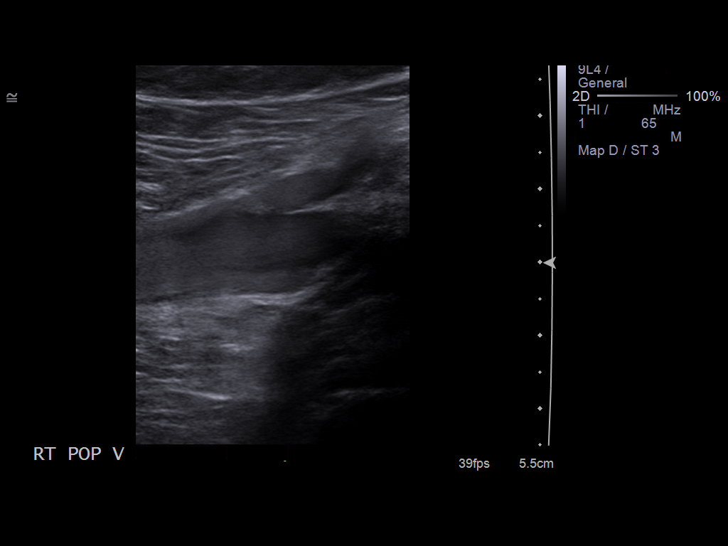

[14 of 24 positions shown; findings below may reference images not displayed]

FINDINGS: The visualized right lower extremity deep venous system
appears patent.

Normal compressibility.  Patent color Doppler flow.  Satisfactory
spectral Doppler with respiratory variation and response to
augmentation.
IMPRESSION: No deep venous thrombosis in the visualized right lower extremity.

## 2013-05-30 ENCOUNTER — Other Ambulatory Visit (HOSPITAL_COMMUNITY): Payer: Self-pay | Admitting: Urology

## 2013-05-30 DIAGNOSIS — N200 Calculus of kidney: Secondary | ICD-10-CM

## 2013-06-03 ENCOUNTER — Ambulatory Visit (HOSPITAL_COMMUNITY)
Admission: RE | Admit: 2013-06-03 | Discharge: 2013-06-03 | Disposition: A | Discharge status: Home / Self Care | Payer: Self-pay | Source: Ambulatory Visit | Attending: Urology | Admitting: Urology

## 2013-06-03 DIAGNOSIS — N200 Calculus of kidney: Secondary | ICD-10-CM

## 2013-06-03 DIAGNOSIS — Z9889 Other specified postprocedural states: Secondary | ICD-10-CM | POA: Insufficient documentation

## 2014-04-16 ENCOUNTER — Encounter (HOSPITAL_COMMUNITY): Payer: Self-pay | Admitting: Emergency Medicine

## 2014-04-16 ENCOUNTER — Emergency Department (HOSPITAL_COMMUNITY)
Admission: EM | Admit: 2014-04-16 | Discharge: 2014-04-16 | Disposition: A | Discharge status: Home / Self Care | Payer: 59 | Attending: Emergency Medicine | Admitting: Emergency Medicine

## 2014-04-16 DIAGNOSIS — Z79899 Other long term (current) drug therapy: Secondary | ICD-10-CM | POA: Insufficient documentation

## 2014-04-16 DIAGNOSIS — Z8639 Personal history of other endocrine, nutritional and metabolic disease: Secondary | ICD-10-CM | POA: Insufficient documentation

## 2014-04-16 DIAGNOSIS — Z87442 Personal history of urinary calculi: Secondary | ICD-10-CM | POA: Insufficient documentation

## 2014-04-16 DIAGNOSIS — Z8719 Personal history of other diseases of the digestive system: Secondary | ICD-10-CM | POA: Insufficient documentation

## 2014-04-16 DIAGNOSIS — Z862 Personal history of diseases of the blood and blood-forming organs and certain disorders involving the immune mechanism: Secondary | ICD-10-CM | POA: Insufficient documentation

## 2014-04-16 DIAGNOSIS — I129 Hypertensive chronic kidney disease with stage 1 through stage 4 chronic kidney disease, or unspecified chronic kidney disease: Secondary | ICD-10-CM | POA: Insufficient documentation

## 2014-04-16 DIAGNOSIS — G44209 Tension-type headache, unspecified, not intractable: Secondary | ICD-10-CM

## 2014-04-16 DIAGNOSIS — Z9889 Other specified postprocedural states: Secondary | ICD-10-CM | POA: Insufficient documentation

## 2014-04-16 DIAGNOSIS — G44229 Chronic tension-type headache, not intractable: Secondary | ICD-10-CM | POA: Insufficient documentation

## 2014-04-16 DIAGNOSIS — I10 Essential (primary) hypertension: Secondary | ICD-10-CM

## 2014-04-16 DIAGNOSIS — F430 Acute stress reaction: Secondary | ICD-10-CM | POA: Insufficient documentation

## 2014-04-16 DIAGNOSIS — N189 Chronic kidney disease, unspecified: Secondary | ICD-10-CM | POA: Insufficient documentation

## 2014-04-16 LAB — BASIC METABOLIC PANEL
BUN: 20 mg/dL (ref 6–23)
CALCIUM: 9.9 mg/dL (ref 8.4–10.5)
CO2: 29 mEq/L (ref 19–32)
Chloride: 101 mEq/L (ref 96–112)
Creatinine, Ser: 2.29 mg/dL — ABNORMAL HIGH (ref 0.50–1.35)
GFR calc Af Amer: 33 mL/min — ABNORMAL LOW (ref >90)
GFR, EST NON AFRICAN AMERICAN: 29 mL/min — AB (ref >90)
GLUCOSE: 127 mg/dL — AB (ref 70–99)
POTASSIUM: 5.4 meq/L — AB (ref 3.7–5.3)
Sodium: 138 mEq/L (ref 137–147)

## 2014-04-16 MED ORDER — AMLODIPINE BESYLATE 5 MG PO TABS
5.0000 mg | ORAL_TABLET | Freq: Once | ORAL | Status: AC
  Administered 2014-04-16: 5 mg via ORAL
  Filled 2014-04-16: qty 1

## 2014-04-16 MED ORDER — DIAZEPAM 5 MG PO TABS: 5.0000 mg | ORAL_TABLET | Freq: Every evening | ORAL | Status: AC | PRN

## 2014-04-16 MED ORDER — ATENOLOL 25 MG PO TABS: 50.0000 mg | ORAL_TABLET | Freq: Every day | ORAL | Status: AC

## 2014-04-16 MED ORDER — ACETAMINOPHEN 500 MG PO TABS
1000.0000 mg | ORAL_TABLET | Freq: Once | ORAL | Status: AC
  Administered 2014-04-16: 1000 mg via ORAL
  Filled 2014-04-16: qty 2

## 2014-04-16 NOTE — Discharge Instructions (Signed)
Hypertension Hypertension, commonly called high blood pressure, is when the force of blood pumping through your arteries is too strong. Your arteries are the blood vessels that carry blood from your heart throughout your body. A blood pressure reading consists of a higher number over a lower number, such as 110/72. The higher number (systolic) is the pressure inside your arteries when your heart pumps. The lower number (diastolic) is the pressure inside your arteries when your heart relaxes. Ideally you want your blood pressure below 120/80. Hypertension forces your heart to work harder to pump blood. Your arteries may become narrow or stiff. Having hypertension puts you at risk for heart disease, stroke, and other problems.  RISK FACTORS Some risk factors for high blood pressure are controllable. Others are not.  Risk factors you cannot control include:   Race. You may be at higher risk if you are African American.  Age. Risk increases with age.  Gender. Men are at higher risk than women before age 14 years. After age 49, women are at higher risk than men. Risk factors you can control include:  Not getting enough exercise or physical activity.  Being overweight.  Getting too much fat, sugar, calories, or salt in your diet.  Drinking too much alcohol. SIGNS AND SYMPTOMS Hypertension does not usually cause signs or symptoms. Extremely high blood pressure (hypertensive crisis) may cause headache, anxiety, shortness of breath, and nosebleed. DIAGNOSIS  To check if you have hypertension, your health care provider will measure your blood pressure while you are seated, with your arm held at the level of your heart. It should be measured at least twice using the same arm. Certain conditions can cause a difference in blood pressure between your right and left arms. A blood pressure reading that is higher than normal on one occasion does not mean that you need treatment. If one blood pressure reading  is high, ask your health care provider about having it checked again. TREATMENT  Treating high blood pressure includes making lifestyle changes and possibly taking medication. Living a healthy lifestyle can help lower high blood pressure. You may need to change some of your habits. Lifestyle changes may include:  Following the DASH diet. This diet is high in fruits, vegetables, and whole grains. It is low in salt, red meat, and added sugars.  Getting at least 2 1/2 hours of brisk physical activity every week.  Losing weight if necessary.  Not smoking.  Limiting alcoholic beverages.  Learning ways to reduce stress. If lifestyle changes are not enough to get your blood pressure under control, your health care provider may prescribe medicine. You may need to take more than one. Work closely with your health care provider to understand the risks and benefits. HOME CARE INSTRUCTIONS  Have your blood pressure rechecked as directed by your health care provider.   Only take medicine as directed by your health care provider. Follow the directions carefully. Blood pressure medicines must be taken as prescribed. The medicine does not work as well when you skip doses. Skipping doses also puts you at risk for problems.   Do not smoke.   Monitor your blood pressure at home as directed by your health care provider. SEEK MEDICAL CARE IF:   You think you are having a reaction to medicines taken.  You have recurrent headaches or feel dizzy.  You have swelling in your ankles.  You have trouble with your vision. SEEK IMMEDIATE MEDICAL CARE IF:  You develop a severe headache or  confusion.  You have unusual weakness, numbness, or feel faint.  You have severe chest or abdominal pain.  You vomit repeatedly.  You have trouble breathing. MAKE SURE YOU:   Understand these instructions.  Will watch your condition.  Will get help right away if you are not doing well or get  worse. Document Released: 10/10/2005 Document Revised: 10/15/2013 Document Reviewed: 08/02/2013 [facility] Patient Information 2015 [place], [place]. This information is not intended to replace advice given to you by your health care provider. Make sure you discuss any questions you have with your health care provider.  Start taking the new blood pressure medicine prescribed.  Start taking 1 tablet daily.  Check your blood pressure every several days ( can check this at the drug store).  If your blood pressure is still elevated,  You may add a second tablet (taking 100mg  strength once daily).  You need a primary doctor and have been given referrals for this, or [place]. You need a recheck of your kidney function as discussed.  You have been given valium which will help calm you and help you sleep as you recover from loss.  Please return here for a recheck for any problems or concerns.

## 2014-04-16 NOTE — ED Provider Notes (Addendum)
CSN: [id number]     Arrival date & time 04/16/14  [id number] History   First MD Initiated Contact with Patient 04/16/14 [phone]     Chief Complaint  Patient presents with  . Hypertension     (Consider location/radiation/quality/duration/timing/severity/associated sxs/prior Treatment) HPI Comments: [name] is a 63 y.o. Male with a history of HTN but has run out of his lisinopril months ago secondary to not having a current pcp.  He found his fiance deceased this am and by the time the paramedics arrived,  He had a severe headache and his blood pressure was found to be "over 200".  He reports a right sided frontal headache, but denies chest pain, sob, focal weakness or numbness and has had no visual disturbance, dizziness or other complaint.  His daughter at the bedside states that he had significant coughing while on lisinopril and suggests another type of bp medicine may be better for him.  Past medical history is also significant for renal disorder and admission last year for ARF as a complication of kidney stones.     The history is provided by the patient.    Past Medical History  Diagnosis Date  . Hypertension   . B12 deficiency   . Hernia     umbilical  . Renal disorder     kidney stones  . Obstructive uropathy 02/08/2012  . Gross hematuria 02/09/2012  . IDA (iron deficiency anemia)   . ARF (acute renal failure)   . Elevated LFTs   . Hyperkalemia    Past Surgical History  Procedure Laterality Date  . Cystoscopy w/ ureteral stent placement  02/08/2012    Procedure: CYSTOSCOPY WITH RETROGRADE PYELOGRAM/URETERAL STENT PLACEMENT;  Surgeon: Ky BarbanMohammad I Javaid, MD;  Location: [facility];  Service: Urology;  Laterality: Bilateral;  Bilateral Double J Stent   . Cystoscopy w/ ureteral stent removal Bilateral 02/26/2013    Procedure: CYSTOSCOPY WITH BILATERAL URETERAL  STENT REMOVAL;  Surgeon: Ky BarbanMohammad I Javaid, MD;  Location: [facility];  Service: Urology;  Laterality: Bilateral;  . Holmium  laser application Right 02/26/2013    Procedure: HOLMIUM LASER LITHOTRIPSY OF RIGHT RENAL CALCULUS; RIGHT URETEROSCOPIC STONE EXTRACTION WITH BASKET;  Surgeon: Ky BarbanMohammad I Javaid, MD;  Location: [facility];  Service: Urology;  Laterality: Right;  . Cystoscopy/retrograde/ureteroscopy/stone extraction with basket Bilateral 02/26/2013    Procedure: CYSTOSCOPY; BILATERAL RETROGRADE PYELOGRAM; BILATERAL URETEROSCOPY; BILATERAL URETERAL STENT PLACEMENT;  Surgeon: Ky BarbanMohammad I Javaid, MD;  Location: [facility];  Service: Urology;  Laterality: Bilateral;  . Cystoscopy with biopsy Left 02/26/2013    Procedure: CYSTOSCOPY WITH LEFT URETERAL BIOPSY;  Surgeon: Ky BarbanMohammad I Javaid, MD;  Location: [facility];  Service: Urology;  Laterality: Left;  . Cystoscopy w/ ureteral stent placement Bilateral 05/16/2013    Procedure: CYSTOSCOPY WITH BILATERAL RETROGRADE PYELOGRAM/BILATERAL URETERAL STENT REMOVAL/BILATERAL URETERAL STENT PLACEMENT;  Surgeon: Ky BarbanMohammad I Javaid, MD;  Location: [facility];  Service: Urology;  Laterality: Bilateral;  . Stone extraction with basket Bilateral 05/16/2013    Procedure: STONE EXTRACTION WITH BASKET;  Surgeon: Ky BarbanMohammad I Javaid, MD;  Location: [facility];  Service: Urology;  Laterality: Bilateral;   Family History  Problem Relation Age of Onset  . Cancer Mother   . Heart attack Father 2940  . Cirrhosis Brother 40    alcoholic  . Liver disease Daughter    History  Substance Use Topics  . Smoking status: Never Smoker   . Smokeless tobacco: Not on file  . Alcohol Use: No    Review of Systems  Constitutional: Negative for fever.  HENT: Negative for congestion and sore throat.   Eyes: Negative.   Respiratory: Negative for chest tightness and shortness of breath.   Cardiovascular: Negative for chest pain.  Gastrointestinal: Negative for nausea and abdominal pain.  Genitourinary: Negative.  Negative for difficulty urinating.  Musculoskeletal: Negative for arthralgias, joint swelling and neck pain.  Skin:  Negative.  Negative for rash and wound.  Neurological: Positive for headaches. Negative for dizziness, weakness, light-headedness and numbness.  Psychiatric/Behavioral: Negative.       Allergies  Milk-related compounds  Home Medications   Prior to Admission medications   Medication Sig Start Date End Date Taking? Authorizing Provider  atenolol (TENORMIN) 25 MG tablet Take 2 tablets (50 mg total) by mouth daily. 04/16/14   Burgess AmorJulie Kamaile Zachow, PA-C  diazepam (VALIUM) 5 MG tablet Take 1 tablet (5 mg total) by mouth at bedtime as needed for anxiety. 04/16/14   Burgess AmorJulie Orion Vandervort, PA-C  lisinopril (PRINIVIL,ZESTRIL) 20 MG tablet Take 20 mg by mouth daily.    Historical Provider, MD   BP 177/102  Pulse 79  Temp(Src) 98 F (36.7 C) (Oral)  Resp 20  Ht 5\' 9"  (1.753 m)  Wt 232 lb (105.235 kg)  BMI 34.24 kg/m2  SpO2 96% Physical Exam  Nursing note and vitals reviewed. Constitutional: He appears well-developed and well-nourished.  HENT:  Head: Normocephalic and atraumatic.  Eyes: Conjunctivae are normal.  Neck: Normal range of motion.  Cardiovascular: Normal rate, regular rhythm, normal heart sounds and intact distal pulses.   Pulmonary/Chest: Effort normal and breath sounds normal. He has no wheezes.  Abdominal: Soft. Bowel sounds are normal. There is no tenderness.  Musculoskeletal: Normal range of motion. He exhibits no edema.  Neurological: He is alert.  Skin: Skin is warm and dry.  Psychiatric: He has a normal mood and affect.    ED Course  Procedures (including critical care time) Labs Review Labs Reviewed  BASIC METABOLIC PANEL - Abnormal; Notable for the following:    Potassium 5.4 (*)    Glucose, Bld 127 (*)    Creatinine, Ser 2.29 (*)    GFR calc non Af Amer 29 (*)    GFR calc Af Amer 33 (*)    All other components within normal limits    Imaging Review No results found.   EKG Interpretation None      MDM   Final diagnoses:  Essential hypertension  Stress reaction   Tension-type headache, not intractable, unspecified chronicity pattern    Discussed lab results and need for obtaining pcp.  He was given referral numbers for this.   He was also given prescription for valium for qhs use and for assistance with grief reaction.  He was headache free at time of dc.  He was given a dose of amlodipine  5 mg while here, with bp dipping to 171/90,  Added an additional 5 mg tablet prior to dc home.    He was placed on atenolol for bp relief.  Ideally, would have liked to see pt on a Ca+ channel blocker, but cost would cause problems with noncompliance. Stressed the importance of close f/u and patient and family at bedside understand this.  He was advised to return here for any worsened or new sx.  Discussed patient and lab findings with Dr Adriana Simasook prior to dc home and agrees with plan.    Burgess AmorJulie Renly Roots, PA-C 04/17/14 [phone]  Burgess AmorJulie Tanae Petrosky, PA-C 04/30/14 1726

## 2014-04-16 NOTE — ED Notes (Addendum)
Fiance passed away this morning and experiencing htn and headache since.  Denies visual disturbances/weakness. Pt reports taking lisinopril PTA.

## 2014-04-16 NOTE — ED Notes (Addendum)
Pt in bed. Pt on bedside monitor. NAD. VVS with expect ion of elevated BP. Pt states he found his girlfriend unresponsive this morning around 0730 and performed CPR; chest compressions for around 10-15 minutes. EMS checked pressure at home states SBP was over 200, can't recall DBP. Since then has had headache.

## 2014-04-16 NOTE — ED Notes (Signed)
Patient with no complaints at this time. Respirations even and unlabored. Skin warm/dry. Discharge instructions reviewed with patient at this time. Patient given opportunity to voice concerns/ask questions. Patient discharged at this time and left Emergency Department with steady gait.   

## 2014-04-17 NOTE — Care Management Note (Signed)
Per Burgess AmorJulie Idol PA, pt told her he did not have insurance, and could not afford his medications. She spoke to him on the phone after D/C and he told her this again.   She asked CM to mail pt the [employer] resources information handouts and Rx discount card. Checked in computer and pt does have  Insurance showing, and it was checked today and is active, per EPIC. Spoke with pt by phone and states that he does have insurance, just no PCP to write Rx for him. Informed him I would mail the information to him, and that it has a number on the 1st page that, if he calls they will help him find a new PCP who is taking new patients and that will take his insurance. He is appreciative of this information.

## 2014-04-18 NOTE — ED Provider Notes (Signed)
Medical screening examination/treatment/procedure(s) were conducted as a shared visit with non-physician practitioner(s) and myself.  I personally evaluated the patient during the encounter.   EKG Interpretation None     Patient has hypertension and has not been taking his medication.  He found his fiance deceased morning. No neurological deficits. Will restart antihypertensive  Donnetta HutchingBrian Cook, MD 04/18/14 [phone]

## 2014-05-01 NOTE — ED Provider Notes (Signed)
Medical screening examination/treatment/procedure(s) were conducted as a shared visit with non-physician practitioner(s) and myself.  I personally evaluated the patient during the encounter.   EKG Interpretation None     History per physician's assistant. Will restart blood pressure medication.  No evidence of neurovascular compromise  Clinton HutchingBrian Matheo Rathbone, MD 05/01/14 [phone]

## 2016-05-04 ENCOUNTER — Emergency Department (HOSPITAL_COMMUNITY): Payer: BLUE CROSS/BLUE SHIELD

## 2016-05-04 ENCOUNTER — Emergency Department (HOSPITAL_COMMUNITY)
Admission: EM | Admit: 2016-05-04 | Discharge: 2016-05-04 | Disposition: A | Discharge status: Home / Self Care | Payer: BLUE CROSS/BLUE SHIELD | Attending: Emergency Medicine | Admitting: Emergency Medicine

## 2016-05-04 ENCOUNTER — Encounter (HOSPITAL_COMMUNITY): Payer: Self-pay | Admitting: Emergency Medicine

## 2016-05-04 DIAGNOSIS — I10 Essential (primary) hypertension: Secondary | ICD-10-CM | POA: Diagnosis not present

## 2016-05-04 DIAGNOSIS — K0889 Other specified disorders of teeth and supporting structures: Secondary | ICD-10-CM | POA: Diagnosis present

## 2016-05-04 DIAGNOSIS — K047 Periapical abscess without sinus: Secondary | ICD-10-CM | POA: Insufficient documentation

## 2016-05-04 DIAGNOSIS — Z79899 Other long term (current) drug therapy: Secondary | ICD-10-CM | POA: Insufficient documentation

## 2016-05-04 MED ORDER — HYDROCODONE-ACETAMINOPHEN 5-325 MG PO TABS: 2.0000 | ORAL_TABLET | ORAL | Status: DC | PRN

## 2016-05-04 MED ORDER — SODIUM CHLORIDE 0.9 % IV BOLUS (SEPSIS): 1000.0000 mL | Freq: Once | INTRAVENOUS | Status: DC

## 2016-05-04 MED ORDER — CLINDAMYCIN HCL 300 MG PO CAPS: ORAL_CAPSULE | ORAL | Status: AC

## 2016-05-04 MED ORDER — CLINDAMYCIN PHOSPHATE 900 MG/50ML IV SOLN
900.0000 mg | Freq: Once | INTRAVENOUS | Status: AC
  Administered 2016-05-04: 900 mg via INTRAVENOUS
  Filled 2016-05-04: qty 50

## 2016-05-04 NOTE — ED Provider Notes (Signed)
CSN: [id number]     Arrival date & time 05/04/16  [id number] History   First MD Initiated Contact with Patient 05/04/16 [phone]     Chief Complaint  Patient presents with  . Facial Swelling     (Consider location/radiation/quality/duration/timing/severity/associated sxs/prior Treatment) Patient is a 65 y.o. male presenting with tooth pain. The history is provided by the patient. No language interpreter was used.  Dental Pain Location:  Generalized Quality:  Dull Severity:  Moderate Onset quality:  Gradual Duration:  1 day Timing:  Constant Progression:  Worsening Chronicity:  New Context: abscess   Previous work-up:  Dental exam Relieved by:  Nothing Worsened by:  Nothing tried Ineffective treatments:  None tried Associated symptoms: facial pain   Risk factors: periodontal disease     Past Medical History  Diagnosis Date  . Hypertension   . B12 deficiency   . Hernia     umbilical  . Renal disorder     kidney stones  . Obstructive uropathy 02/08/2012  . Gross hematuria 02/09/2012  . IDA (iron deficiency anemia)   . ARF (acute renal failure) (HCC)   . Elevated LFTs   . Hyperkalemia    Past Surgical History  Procedure Laterality Date  . Cystoscopy w/ ureteral stent placement  02/08/2012    Procedure: CYSTOSCOPY WITH RETROGRADE PYELOGRAM/URETERAL STENT PLACEMENT;  Surgeon: Ky BarbanMohammad I Javaid, MD;  Location: [facility];  Service: Urology;  Laterality: Bilateral;  Bilateral Double J Stent   . Cystoscopy w/ ureteral stent removal Bilateral 02/26/2013    Procedure: CYSTOSCOPY WITH BILATERAL URETERAL  STENT REMOVAL;  Surgeon: Ky BarbanMohammad I Javaid, MD;  Location: [facility];  Service: Urology;  Laterality: Bilateral;  . Holmium laser application Right 02/26/2013    Procedure: HOLMIUM LASER LITHOTRIPSY OF RIGHT RENAL CALCULUS; RIGHT URETEROSCOPIC STONE EXTRACTION WITH BASKET;  Surgeon: Ky BarbanMohammad I Javaid, MD;  Location: [facility];  Service: Urology;  Laterality: Right;  .  Cystoscopy/retrograde/ureteroscopy/stone extraction with basket Bilateral 02/26/2013    Procedure: CYSTOSCOPY; BILATERAL RETROGRADE PYELOGRAM; BILATERAL URETEROSCOPY; BILATERAL URETERAL STENT PLACEMENT;  Surgeon: Ky BarbanMohammad I Javaid, MD;  Location: [facility];  Service: Urology;  Laterality: Bilateral;  . Cystoscopy with biopsy Left 02/26/2013    Procedure: CYSTOSCOPY WITH LEFT URETERAL BIOPSY;  Surgeon: Ky BarbanMohammad I Javaid, MD;  Location: [facility];  Service: Urology;  Laterality: Left;  . Cystoscopy w/ ureteral stent placement Bilateral 05/16/2013    Procedure: CYSTOSCOPY WITH BILATERAL RETROGRADE PYELOGRAM/BILATERAL URETERAL STENT REMOVAL/BILATERAL URETERAL STENT PLACEMENT;  Surgeon: Ky BarbanMohammad I Javaid, MD;  Location: [facility];  Service: Urology;  Laterality: Bilateral;  . Stone extraction with basket Bilateral 05/16/2013    Procedure: STONE EXTRACTION WITH BASKET;  Surgeon: Ky BarbanMohammad I Javaid, MD;  Location: [facility];  Service: Urology;  Laterality: Bilateral;   Family History  Problem Relation Age of Onset  . Cancer Mother   . Heart attack Father 5940  . Cirrhosis Brother 40    alcoholic  . Liver disease Daughter    Social History  Substance Use Topics  . Smoking status: Never Smoker   . Smokeless tobacco: None  . Alcohol Use: No    Review of Systems  All other systems reviewed and are negative.     Allergies  Milk-related compounds  Home Medications   Prior to Admission medications   Medication Sig Start Date End Date Taking? Authorizing Provider  atenolol (TENORMIN) 25 MG tablet Take 2 tablets (50 mg total) by mouth daily. Patient not taking: Reported on 05/04/2016 04/16/14   Burgess AmorJulie Idol, PA-C  clindamycin (CLEOCIN) 300 MG capsule One po qid 05/04/16   Elson Areas, PA-C  diazepam (VALIUM) 5 MG tablet Take 1 tablet (5 mg total) by mouth at bedtime as needed for anxiety. Patient not taking: Reported on 05/04/2016 04/16/14   Burgess Amor, PA-C   BP 150/86 mmHg  Pulse 68  Temp(Src) 98.1 F (36.7  C) (Oral)  Resp 18  Ht  (1.753 m)  Wt 107.049 kg  BMI 34.84 kg/m2  SpO2 96% Physical Exam  Constitutional: He is oriented to person, place, and time. He appears well-developed and well-nourished.  HENT:  Head: Normocephalic and atraumatic.  Right Ear: External ear normal.  Left Ear: External ear normal.  Swollen face, mouth no drainage, swollen gum line  Eyes: Conjunctivae are normal. Pupils are equal, round, and reactive to light.  Cardiovascular: Normal rate.   Pulmonary/Chest: Effort normal.  Musculoskeletal: Normal range of motion.  Neurological: He is alert and oriented to person, place, and time. He has normal reflexes.  Skin: Skin is warm.  Psychiatric: He has a normal mood and affect.    ED Course  Procedures (including critical care time) Labs Review Labs Reviewed - No data to display  Imaging Review Dg Chest 2 View  05/04/2016  CLINICAL DATA:  Weakness and history of prior smoking EXAM: CHEST  2 VIEW COMPARISON:  04/03/2013 FINDINGS: Cardiac shadow is within normal limits. Mild nodularity is noted in the left perihilar region likely related to a vessel on and as it was present on the prior exam. No other focal abnormality is seen. IMPRESSION: Likely prominent vessel on end.  No other focal abnormality is seen. Electronically Signed   By: Alcide Clever M.D.   On: 05/04/2016 11:59   I have personally reviewed and evaluated these images and lab results as part of my medical decision-making.   EKG Interpretation None      MDM   Final diagnoses:  Dental abscess    Meds ordered this encounter  Medications  . clindamycin (CLEOCIN) IVPB 900 mg    Sig:     Order Specific Question:  Antibiotic Indication:    Answer:  Cellulitis  . DISCONTD: sodium chloride 0.9 % bolus 1,000 mL    Sig:   . clindamycin (CLEOCIN) 300 MG capsule    Sig: One po qid    Dispense:  40 capsule    Refill:  0    Order Specific Question:  Supervising Provider    Answer:  MILLER,  BRIAN [[id number]]  . DISCONTD: HYDROcodone-acetaminophen (NORCO/VICODIN) 5-325 MG tablet    Sig: Take 2 tablets by mouth every 4 (four) hours as needed.    Dispense:  16 tablet    Refill:  0    Order Specific Question:  Supervising Provider    Answer:  Eber Hong [[id number]]  An After Visit Summary was printed and given to the patient.    Lonia Skinner Weippe, PA-C 05/04/16 1524  Jacalyn Lefevre, MD 05/05/16 [phone]

## 2016-05-04 NOTE — ED Notes (Signed)
Pt to be held until CXR reviewed. Delay explained to pt and family.

## 2016-05-04 NOTE — Discharge Instructions (Signed)

## 2016-05-04 NOTE — ED Notes (Signed)
PT c/o right lower dental pain with right sided facial swelling x1 day with hx of abscess to same area per patient.

## 2017-04-19 NOTE — Congregational Nurse Program (Unsigned)
Congregational Nurse Program Note  Date of Encounter: 04/19/2017  Past Medical History: Past Medical History:  Diagnosis Date  . ARF (acute renal failure) (HCC)   . B12 deficiency   . Elevated LFTs   . Gross hematuria 02/09/2012  . Hernia    umbilical  . Hyperkalemia   . Hypertension   . IDA (iron deficiency anemia)   . Obstructive uropathy 02/08/2012  . Renal disorder    kidney stones    Encounter Details:     CNP Questionnaire - 04/19/17 1311      Patient Demographics   Is this a new or existing patient? Existing   Patient is considered a/an Not Applicable   Race African-American/Black     Patient Assistance   Location of Patient Assistance [address]   Patient's financial/insurance status Medicare   Uninsured Patient ([place] [employer]s) No   Food insecurities addressed Provided food supplies   Transportation assistance Yes  Rides with friends   Type of Network engineerAssistance Volunteer   Assistance securing medications No   Educational health offerings Hypertension;Medications;Safety;Nutrition     Encounter Details   Primary purpose of visit Education/Health Concerns;Safety   Was an Emergency Department visit averted? Not Applicable   Does patient have a medical provider? Yes  [facility]   Patient referred to Follow up with established PCP   Was a mental health screening completed? (GAINS tool) No   Does patient have dental issues? No   Does patient have vision issues? No   Does your patient have an abnormal blood pressure today? Yes   Since previous encounter, have you referred patient for abnormal blood pressure that resulted in a new diagnosis or medication change? No   Does your patient have an abnormal blood glucose today? No   Since previous encounter, have you referred patient for abnormal blood glucose that resulted in a new diagnosis or medication change? No   Was there a life-saving intervention made? No     States" he is taking B/P and  Cholesterol Meds.was told by his doctor to get some iron pills and Vitamins . Encouraged to take meds as directed. Cecilie KicksLeanna Netanya Yazdani, RN [phone]

## 2017-07-04 ENCOUNTER — Encounter (INDEPENDENT_AMBULATORY_CARE_PROVIDER_SITE_OTHER): Payer: Self-pay | Admitting: Internal Medicine

## 2017-07-04 ENCOUNTER — Encounter (INDEPENDENT_AMBULATORY_CARE_PROVIDER_SITE_OTHER): Payer: Self-pay

## 2017-08-01 ENCOUNTER — Ambulatory Visit (INDEPENDENT_AMBULATORY_CARE_PROVIDER_SITE_OTHER): Payer: Disability Insurance | Admitting: Internal Medicine

## 2017-09-19 ENCOUNTER — Ambulatory Visit (INDEPENDENT_AMBULATORY_CARE_PROVIDER_SITE_OTHER): Payer: Disability Insurance | Admitting: Internal Medicine

## 2017-11-27 ENCOUNTER — Other Ambulatory Visit (HOSPITAL_COMMUNITY): Payer: Self-pay | Admitting: Nephrology

## 2017-11-27 DIAGNOSIS — N183 Chronic kidney disease, stage 3 unspecified: Secondary | ICD-10-CM

## 2017-12-12 ENCOUNTER — Ambulatory Visit (HOSPITAL_COMMUNITY): Admission: RE | Admit: 2017-12-12 | Payer: Medicare Other | Source: Ambulatory Visit

## 2018-01-22 DEATH — deceased
